# Patient Record
Sex: Female | Born: 1935 | Race: White | Hispanic: No | Smoking: Never smoker
Health system: Southern US, Community
[De-identification: ages and names within clinical notes are randomized; demographics above are authoritative.]

## PROBLEM LIST (undated history)

## (undated) DIAGNOSIS — F32A Depression, unspecified: Secondary | ICD-10-CM

## (undated) DIAGNOSIS — F0390 Unspecified dementia without behavioral disturbance: Secondary | ICD-10-CM

## (undated) DIAGNOSIS — I214 Non-ST elevation (NSTEMI) myocardial infarction: Secondary | ICD-10-CM

## (undated) DIAGNOSIS — I10 Essential (primary) hypertension: Secondary | ICD-10-CM

## (undated) DIAGNOSIS — I82409 Acute embolism and thrombosis of unspecified deep veins of unspecified lower extremity: Secondary | ICD-10-CM

## (undated) DIAGNOSIS — F329 Major depressive disorder, single episode, unspecified: Secondary | ICD-10-CM

## (undated) DIAGNOSIS — T7840XA Allergy, unspecified, initial encounter: Secondary | ICD-10-CM

## (undated) HISTORY — DX: Unspecified dementia, unspecified severity, without behavioral disturbance, psychotic disturbance, mood disturbance, and anxiety: F03.90

## (undated) HISTORY — DX: Depression, unspecified: F32.A

## (undated) HISTORY — DX: Major depressive disorder, single episode, unspecified: F32.9

## (undated) HISTORY — DX: Essential (primary) hypertension: I10

## (undated) HISTORY — DX: Allergy, unspecified, initial encounter: T78.40XA

---
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
## 1966-03-02 HISTORY — PX: ABDOMINAL HYSTERECTOMY: SHX81

## 1981-03-02 HISTORY — PX: SPINE SURGERY: SHX786

## 1990-03-02 HISTORY — PX: EYE SURGERY: SHX253

## 1990-03-02 LAB — HM COLONOSCOPY

## 1993-03-02 HISTORY — PX: BREAST SURGERY: SHX581

## 2005-08-25 ENCOUNTER — Ambulatory Visit: Payer: Self-pay | Admitting: Internal Medicine

## 2005-09-03 ENCOUNTER — Ambulatory Visit: Payer: Self-pay | Admitting: Internal Medicine

## 2005-09-08 ENCOUNTER — Ambulatory Visit: Payer: Self-pay | Admitting: *Deleted

## 2005-09-22 ENCOUNTER — Ambulatory Visit: Payer: Self-pay | Admitting: Internal Medicine

## 2005-11-23 ENCOUNTER — Ambulatory Visit: Payer: Self-pay | Admitting: Family Medicine

## 2006-01-25 ENCOUNTER — Ambulatory Visit: Payer: Self-pay | Admitting: Family Medicine

## 2006-07-05 ENCOUNTER — Telehealth (INDEPENDENT_AMBULATORY_CARE_PROVIDER_SITE_OTHER): Payer: Self-pay | Admitting: *Deleted

## 2006-07-06 ENCOUNTER — Ambulatory Visit: Payer: Self-pay | Admitting: Family Medicine

## 2006-07-06 DIAGNOSIS — I10 Essential (primary) hypertension: Secondary | ICD-10-CM

## 2006-07-06 DIAGNOSIS — G309 Alzheimer's disease, unspecified: Secondary | ICD-10-CM

## 2006-07-06 DIAGNOSIS — F0280 Dementia in other diseases classified elsewhere without behavioral disturbance: Secondary | ICD-10-CM

## 2006-07-07 ENCOUNTER — Telehealth (INDEPENDENT_AMBULATORY_CARE_PROVIDER_SITE_OTHER): Payer: Self-pay | Admitting: *Deleted

## 2006-07-08 ENCOUNTER — Encounter: Payer: Self-pay | Admitting: Family Medicine

## 2006-07-09 ENCOUNTER — Inpatient Hospital Stay (HOSPITAL_COMMUNITY): Admission: AD | Admit: 2006-07-09 | Discharge: 2006-07-13 | Payer: Self-pay | Admitting: Internal Medicine

## 2006-07-09 ENCOUNTER — Ambulatory Visit: Payer: Self-pay | Admitting: Family Medicine

## 2006-07-10 ENCOUNTER — Ambulatory Visit: Payer: Self-pay | Admitting: Internal Medicine

## 2006-07-16 ENCOUNTER — Ambulatory Visit: Payer: Self-pay | Admitting: Family Medicine

## 2006-07-25 ENCOUNTER — Emergency Department: Payer: Self-pay | Admitting: Emergency Medicine

## 2006-07-25 ENCOUNTER — Other Ambulatory Visit: Payer: Self-pay

## 2006-09-28 ENCOUNTER — Telehealth: Payer: Self-pay | Admitting: Family Medicine

## 2006-10-14 ENCOUNTER — Encounter (INDEPENDENT_AMBULATORY_CARE_PROVIDER_SITE_OTHER): Payer: Self-pay | Admitting: *Deleted

## 2006-10-19 ENCOUNTER — Ambulatory Visit: Payer: Self-pay | Admitting: Family Medicine

## 2007-01-21 ENCOUNTER — Encounter (INDEPENDENT_AMBULATORY_CARE_PROVIDER_SITE_OTHER): Payer: Self-pay | Admitting: *Deleted

## 2007-05-01 ENCOUNTER — Emergency Department: Payer: Self-pay | Admitting: Emergency Medicine

## 2007-05-01 ENCOUNTER — Other Ambulatory Visit: Payer: Self-pay

## 2007-05-01 ENCOUNTER — Encounter: Payer: Self-pay | Admitting: Family Medicine

## 2007-05-05 ENCOUNTER — Ambulatory Visit: Payer: Self-pay | Admitting: Family Medicine

## 2007-05-20 ENCOUNTER — Telehealth: Payer: Self-pay | Admitting: Family Medicine

## 2007-06-06 ENCOUNTER — Telehealth: Payer: Self-pay | Admitting: Family Medicine

## 2007-06-08 ENCOUNTER — Ambulatory Visit: Payer: Self-pay | Admitting: Family Medicine

## 2007-06-22 ENCOUNTER — Ambulatory Visit: Payer: Self-pay | Admitting: Family Medicine

## 2007-10-06 ENCOUNTER — Ambulatory Visit: Payer: Self-pay | Admitting: Family Medicine

## 2007-10-17 ENCOUNTER — Ambulatory Visit: Payer: Self-pay | Admitting: Family Medicine

## 2007-10-17 LAB — CONVERTED CEMR LAB
BUN: 17 mg/dL (ref 6–23)
CO2: 30 meq/L (ref 19–32)
Calcium: 9.4 mg/dL (ref 8.4–10.5)
Glucose, Bld: 97 mg/dL (ref 70–99)
Sodium: 141 meq/L (ref 135–145)

## 2007-11-09 ENCOUNTER — Ambulatory Visit: Payer: Self-pay | Admitting: Family Medicine

## 2008-01-24 ENCOUNTER — Telehealth: Payer: Self-pay | Admitting: Family Medicine

## 2008-02-02 ENCOUNTER — Telehealth: Payer: Self-pay | Admitting: Family Medicine

## 2008-02-13 ENCOUNTER — Ambulatory Visit: Payer: Self-pay | Admitting: Family Medicine

## 2008-10-31 ENCOUNTER — Telehealth: Payer: Self-pay | Admitting: Family Medicine

## 2009-05-15 ENCOUNTER — Ambulatory Visit: Payer: Self-pay | Admitting: Family Medicine

## 2009-06-27 ENCOUNTER — Encounter: Payer: Self-pay | Admitting: Family Medicine

## 2009-06-28 ENCOUNTER — Telehealth: Payer: Self-pay | Admitting: Family Medicine

## 2009-08-07 ENCOUNTER — Encounter: Payer: Self-pay | Admitting: Family Medicine

## 2010-02-07 ENCOUNTER — Encounter: Payer: Self-pay | Admitting: Family Medicine

## 2010-02-21 ENCOUNTER — Encounter: Payer: Self-pay | Admitting: Family Medicine

## 2010-02-21 DIAGNOSIS — M1610 Unilateral primary osteoarthritis, unspecified hip: Secondary | ICD-10-CM

## 2010-02-21 DIAGNOSIS — R269 Unspecified abnormalities of gait and mobility: Secondary | ICD-10-CM

## 2010-02-21 DIAGNOSIS — IMO0001 Reserved for inherently not codable concepts without codable children: Secondary | ICD-10-CM

## 2010-02-28 ENCOUNTER — Encounter: Payer: Self-pay | Admitting: Family Medicine

## 2010-04-01 NOTE — Progress Notes (Signed)
Summary: needs FL2 completed  Phone Note Call from Patient Call back at Home Phone [phone]   Caller: Daughter- June Summary of Call: Pt's daughter wants to get pt into assisted living at [facility].  She has dropped off an FL2 form to be completed.  She will pick this up when ready.  Form is on your desk. Initial call taken by: Lowella Petties CMA,  June 28, 2009 10:17 AM

## 2010-04-01 NOTE — Miscellaneous (Signed)
Summary: FL2  FL2   Imported By: Lanelle Bal 08/14/2009 10:25:45  _____________________________________________________________________  External Attachment:    Type:   Image     Comment:   External Document

## 2010-04-01 NOTE — Miscellaneous (Signed)
Summary: FL2  FL2   Imported By: Lanelle Bal 07/19/2009 14:08:04  _____________________________________________________________________  External Attachment:    Type:   Image     Comment:   External Document

## 2010-04-01 NOTE — Assessment & Plan Note (Signed)
Summary: FILL OUT FORM FOR VA/CLE   Vital Signs:  Patient profile:   77 year old female Height:      67 inches Weight:      181.8 pounds Temp:     97.6 degrees F oral Pulse rate:   100 / minute Pulse rhythm:   regular BP sitting:   106 / 60  (right arm) Cuff size:   large  Vitals Entered By: Benny Lennert CMA Duncan Dull) (May 15, 2009 9:35 AM)  History of Present Illness: Chief complaint fill out paper work for [employer]  Daughter in talking to [facility]...she is WW2 widow veteran...can qualify for widows benefits. Daughter is looking into having Ms. Creek look into [employer]...only gets 2 hour a day care wherre she is know. le..somewhat more aware.   Dementia, stable. Not on aricept or namenda..Daighter not interested in [employer].Tanylah Yoho not interested in [employer].  Well controlled depression of medicaiton.   Problems Prior to Update: 1)  Syncope  (ICD-780.2) 2)  Anxiety  (ICD-300.00) 3)  Constipation  (ICD-564.00) 4)  Hypertension  (ICD-401.9) 5)  Depression  (ICD-311) 6)  Dementia  (ICD-294.8) 7)  Cellulitis  (ICD-682.9)  Current Medications (verified): 1)  Walker 2)  Lisinopril-Hydrochlorothiazide 10-12.5 Mg  Tabs (Lisinopril-Hydrochlorothiazide) .Marland Kitchen.. 1 Tab By Mouth Daily  Allergies: 1)  ! Penicillin G Potassium (Penicillin G Potassium)  Past History:  Past medical, surgical, family and social histories (including risk factors) reviewed, and no changes noted (except as noted below).  Past Medical History: Reviewed history from 07/06/2006 and no changes required. Dementia Depression Hypertension  Past Surgical History: Reviewed history from 07/08/2006 and no changes required. 1938 Tonsillectomy 1968 Hysterectomy 1983 Surgery for ruptured disc lumbar 1992 Bilateral cataract removal 1995 Lumpectomy, left breast - benign  Family History: Reviewed history from 07/08/2006 and no changes required. Father:  Died 107 never sick, old  age Mother: Died 26 leukemia Siblings: brother died 20 cancer of stomach, heavy smoker brother died severe rheumatoid arthritis, probs breathing, suicide brother died suicide sister died HTN sister died 66 valve replacement sister died 8, heart trouble sister died (?) CV: (+) HBP:  (+) DM:  (-) Arthritis:  (+) Breast CA:  daughter Colon CA:  (+) brother Depression:  (+) family, older brothers committed suicide  Social History: Reviewed history from 07/08/2006 and no changes required. Marital Status: widowed since October Children:  Occupation: Secretarial work, [employer] Hobbies:  Walking, reading Exercise:  Walking Never Smoked Alcohol use-no Drug use-no  Review of Systems General:  Denies fatigue and fever. CV:  Denies chest pain or discomfort. Resp:  Denies shortness of breath. GI:  Denies abdominal pain. GU:  Denies dysuria.  Physical Exam  General:  elderly female in NAD, smiling, pleasant Mouth:  Oral mucosa and oropharynx without lesions or exudates.  MMM Neck:  no carotid bruit or thyromegaly  Lungs:  Normal respiratory effort, chest expands symmetrically. Lungs are clear to auscultation, no crackles or wheezes. Heart:  Normal rate and regular rhythm. S1 and S2 normal without gallop, murmur, click, rub or other extra sounds. Msk:  using walker, decrease ROM neck, lumbar spine Walks fairly well with walker for stability Pulses:  R and L posterior tibial pulses are full and equal bilaterally  Extremities:  no edema  Neurologic:  No cranial nerve deficits noted. DTRs are symmetrical throughout. Sensory, motor functions appear intact.finger-to-nose normal and abnormal gait.     Impression & Recommendations:  Problem # 1:  DEMENTIA (ICD-294.8)  Stable on no medicaiton. Forms for VA completed.   Problem # 2:  HYPERTENSION (ICD-401.9) Well controlled on current meds.  Her updated medication list for this problem includes:     Lisinopril-hydrochlorothiazide 10-12.5 Mg Tabs (Lisinopril-hydrochlorothiazide) .Marland Kitchen... 1 tab by mouth daily  Problem # 3:  DEPRESSION (ICD-311) Resolved..on no medicaiton.  The following medications were removed from the medication list:    Paroxetine Hcl 20 Mg Tabs (Paroxetine hcl) .Marland Kitchen... 1 tab by mouth daily  Complete Medication List: 1)  Walker  2)  Lisinopril-hydrochlorothiazide 10-12.5 Mg Tabs (Lisinopril-hydrochlorothiazide) .Marland Kitchen.. 1 tab by mouth daily  Current Allergies (reviewed today): ! PENICILLIN G POTASSIUM (PENICILLIN G POTASSIUM)

## 2010-04-03 NOTE — Miscellaneous (Signed)
Summary: PT Orders/[facility]  PT Orders/[facility]   Imported By: Lanelle Bal 03/04/2010 10:59:46  _____________________________________________________________________  External Attachment:    Type:   Image     Comment:   External Document

## 2010-04-03 NOTE — Miscellaneous (Signed)
Summary: PT Eval/[facility]  PT Eval/[facility]   Imported By: Lanelle Bal 03/07/2010 14:10:51  _____________________________________________________________________  External Attachment:    Type:   Image     Comment:   External Document

## 2010-04-03 NOTE — Miscellaneous (Signed)
Summary: [facility] Order[facility]  [facility]   Imported By: Lanelle Bal 02/13/2010 16:02:39  _____________________________________________________________________  External Attachment:    Type:   Image     Comment:   External Document

## 2010-04-29 ENCOUNTER — Ambulatory Visit (INDEPENDENT_AMBULATORY_CARE_PROVIDER_SITE_OTHER): Payer: Medicare Other | Admitting: Family Medicine

## 2010-04-29 ENCOUNTER — Encounter: Payer: Self-pay | Admitting: Family Medicine

## 2010-04-29 DIAGNOSIS — R21 Rash and other nonspecific skin eruption: Secondary | ICD-10-CM | POA: Insufficient documentation

## 2010-04-29 DIAGNOSIS — I10 Essential (primary) hypertension: Secondary | ICD-10-CM

## 2010-04-29 DIAGNOSIS — F068 Other specified mental disorders due to known physiological condition: Secondary | ICD-10-CM

## 2010-05-08 NOTE — Assessment & Plan Note (Signed)
Summary: fill out forms for va for widow benefits /alc   Vital Signs:  Patient profile:   77 year old female Height:      67 inches Weight:      168.75 pounds BMI:     26.53 Temp:     97.7 degrees F oral Pulse rate:   80 / minute Pulse rhythm:   regular BP sitting:   130 / 80  (left arm) Cuff size:   regular  Vitals Entered By: Benny Lennert CMA Duncan Dull) (April 29, 2010 9:25 AM)  History of Present Illness: 77 year old  female with history of dementia, HTN and depression presents to have administrative physical to complete forms for widow benifits. She is WW2  veteran widow...can qualify for widows benefits.   Dementia, stable on no medication per her daughter who is present at todays OV. She did not tolerate aricept and namenda in past.  She is  living in an independant living situation and doing well... at [facility]. She did not qualify for [facility]. Has caregiver hired to help around the house.. comes over three days a week. [place] provides one meal a day. Cannot cook, clean, do finances, cannot set out medicaitons.  She picks at her arms and legs...denies itchy skin. Lotion helps.  HTN, well controlled.. has stopped her medicaiton since weight loss. Has lost 20 lbs ointentionally per daughter. She eats and drinks very well. Minimal walking or exercsie.   Depression, resolved. No behavoiral problems per daughter.  No recent falls.  Walks very slowly.. uses walker at all times, with seat.  Hearing has worsened but does not bother her. She reads pretty much all day.  Problems Prior to Update: 1)  Skin Rash  (ICD-782.1) 2)  Hypertension  (ICD-401.9) 3)  Dementia  (ICD-294.8)  Current Medications (verified): 1)  Walker 2)  Lisinopril-Hydrochlorothiazide 10-12.5 Mg  Tabs (Lisinopril-Hydrochlorothiazide) .Marland Kitchen.. 1 Tab By Mouth Daily  Allergies (verified): 1)  ! Penicillin G Potassium (Penicillin G Potassium)  Past History:  Past medical, surgical, family and  social histories (including risk factors) reviewed, and no changes noted (except as noted below).  Past Medical History: Reviewed history from 07/06/2006 and no changes required. Dementia Depression Hypertension  Past Surgical History: Reviewed history from 07/08/2006 and no changes required. 1938 Tonsillectomy 1968 Hysterectomy 1983 Surgery for ruptured disc lumbar 1992 Bilateral cataract removal 1995 Lumpectomy, left breast - benign  Family History: Reviewed history from 07/08/2006 and no changes required. Father:  Died 11 never sick, old age Mother: Died 89 leukemia Siblings: brother died 35 cancer of stomach, heavy smoker brother died severe rheumatoid arthritis, probs breathing, suicide brother died suicide sister died HTN sister died 29 valve replacement sister died 75, heart trouble sister died (?) CV: (+) HBP:  (+) DM:  (-) Arthritis:  (+) Breast CA:  daughter Colon CA:  (+) brother Depression:  (+) family, older brothers committed suicide  Social History: Reviewed history from 07/08/2006 and no changes required. Marital Status: widowed since October Children:  Occupation: Secretarial work, [employer] Hobbies:  Walking, reading Exercise:  Walking Never Smoked Alcohol use-no Drug use-no  Review of Systems General:  Denies fatigue and fever. CV:  Denies chest pain or discomfort. Resp:  Denies shortness of breath. GI:  Denies abdominal pain. GU:  Denies dysuria.  Physical Exam  General:  overweight female in NAD  Ears:  External ear exam shows no significant lesions or deformities.  Otoscopic examination reveals clear canals, tympanic membranes are intact  bilaterally without bulging, retraction, inflammation or discharge. Hearing is grossly normal bilaterally. Nose:  External nasal examination shows no deformity or inflammation. Nasal mucosa are pink and moist without lesions or exudates. Mouth:  Oral mucosa and oropharynx without lesions or  exudates.  Teeth in good repair. Neck:  no carotid bruit or thyromegaly no cervical or supraclavicular lymphadenopathy  Lungs:  Normal respiratory effort, chest expands symmetrically. Lungs are clear to auscultation, no crackles or wheezes. Heart:  Normal rate and regular rhythm. S1 and S2 normal without gallop, murmur, click, rub or other extra sounds. Abdomen:  Bowel sounds positive,abdomen soft and non-tender without masses, organomegaly or hernias noted. Pulses:  R and L posterior tibial pulses are full and equal bilaterally  Extremities:  no edema Neurologic:  alert & oriented X3.  22/30 MMSE Skin:  dry skin and areas of erythematous nodules, scarring Psych:  good eye contact, not anxious appearing, and not depressed appearing.     Impression & Recommendations:  Problem # 1:  DEMENTIA (ICD-294.8) She has stable moderate dementia... she is unable to perform activities of daily living such as bathing, cooking, cleaning, finances, medicaitions due to her dementia.  She cannot drive.   MMSE today showed 22/30. She needs daily assistance for caregivers.  Problem # 2:  HYPERTENSION (ICD-401.9) Well controlled since weight loss... no wstable off medicaiton. Encouraged exercise as tolerated, weight loss, healthy eating habits.  The following medications were removed from the medication list:    Lisinopril-hydrochlorothiazide 10-12.5 Mg Tabs (Lisinopril-hydrochlorothiazide) .Marland Kitchen... 1 tab by mouth daily  Problem # 3:  SKIN RASH (ICD-782.1)  Due to dry skoin and constant picking at skin... uncontrollable per pt and daughter. Will treat with Sarna lotion and topical steorid.   Her updated medication list for this problem includes:    Triamcinolone Acetonide 0.5 % Crea (Triamcinolone acetonide) .Marland Kitchen... Aaa two times a day  Complete Medication List: 1)  Walker  2)  Triamcinolone Acetonide 0.5 % Crea (Triamcinolone acetonide) .... Aaa two times a day  Patient Instructions: 1)  Schedule medicare  wellness in 6 months. 2)  Fasting labs prior BMET, cbc Dx 401.1 3)  Apply SARNA lotion to skin for itching. 4)  May use steroid cream on irritated areas two times a day x 2 weeks. ... [facility] to [facility] [address].  5)    Prescriptions: TRIAMCINOLONE ACETONIDE 0.5 % CREA (TRIAMCINOLONE ACETONIDE) AAA two times a day  #30 gm x 0   Entered and Authorized by:   Kerby Nora MD   Signed by:   Kerby Nora MD on 04/29/2010   Method used:   [facility] to        [facility] [phone]* (retail)       [address] [place], [place]  [address]       Ph: [phone] or [phone]       Fax: [phone]   RxID:   [id number]    Orders Added: 1)  Est. Patient Level IV [81829]    Current Allergies (reviewed today): ! PENICILLIN G POTASSIUM (PENICILLIN G POTASSIUM)

## 2010-05-13 NOTE — Letter (Signed)
Summary: MMSE Form  MMSE Form   Imported By: Lanelle Bal 05/06/2010 08:36:32  _____________________________________________________________________  External Attachment:    Type:   Image     Comment:   External Document

## 2010-06-30 ENCOUNTER — Telehealth: Payer: Self-pay | Admitting: Internal Medicine

## 2010-07-15 NOTE — H&P (Signed)
[facility] ADMISSION   NAME:Suzanne Mcmillan, Suzanne Mcmillan                          MRN:          [id number]  DATE:07/09/2006                            DOB:          1916-02-26    CHIEF COMPLAINT:  Cellulitis.   HISTORY OF PRESENT ILLNESS:  Ms. Suzanne Mcmillan is an 77 year old female with a  history of dementia, depression and hypertension who presented to clinic  today for followup on a rash. She initially noted an erythematous rash  on her left forearm after she scratched her arm on a screen door  approximately 1 month ago. The area stayed looking okay but did not heal  and then appeared to have spreading erythema around the site. She and  her daughter applied antibiotic ointment and kept the area covered but  the rash has gradually spread along her left forearm. She was seen at an  office visit on June 06, 2006 and was placed on Bactrim for possible  cellulitis. Bactrim was chosen because of allergy to PENICILLIN and  because she lives in a senior center and is at risk for MRSA. After  taking one dose of the Bactrim, she began having facial swelling and  itching. Bactrim was then stopped and she was changed over to  doxicycline on Jul 07, 2006. She has now had 3 total days of oral  antibiotics but the rash continues to spread up her arm. The rash is  nontender but is warm to the touch. She does not feel ill otherwise and  denies fever, chills, nausea, vomiting,  and diarrhea.   PAST MEDICAL HISTORY:  1. Dementia.  2. Depression.  3. Hypertension.   PAST SURGICAL HISTORY:  1. Tonsillectomy.  2. Hysterectomy.  3. Back surgery.  4. Bilateral cataract removal.  5. Lumpectomy for left benign breast tumor.   FAMILY HISTORY:  Father died at age 62, never sick and passed away of  old age. Mother died at age 90 with leukemia. She has a brother who died  at age 99 with stomach cancer as well as a brother who had severe  rheumatoid  arthritis and another brother who died of suicide. She had  several other sisters who had various high blood pressure, heart trouble  and valve replacement.   REVIEW OF SYSTEMS:  See HPI but otherwise intermittent dizziness, no  confusion or mental status changes.   PHYSICAL EXAMINATION:  GENERAL:  Well-developed, well-nourished in no  acute distress. Alert, appropriate and cooperative throughout exam.  HEENT:  Head normocephalic, atraumatic without obvious abnormalities.  Eyes no corneal or conjunctival inflammation. Extraocular muscles  intact. PERRLA. Vision grossly normal. Ears, no external ear lesions.  TMs clear. Hearing grossly normal bilaterally. External nasal exam  without deformity or inflammation. Pink, moist nasal mucosa. Mouth,  dentures, moist mucous membranes.  NECK:  No deformities, masses, tenderness, thyromegaly or cervical  lymphadenopathy.  LUNGS:  Clear to auscultation bilaterally, no wheezes, rales or rhonchi.  HEART:  Regular rate and rhythm, no murmurs, rubs or gallops. Normal  PMI, 2+ peripheral pulses.  ABDOMEN:  Bowel sounds positive. Abdomen soft, nontender without masses,  organomegaly or hernias noted.  NEUROLOGIC:  Cranial nerves II-XII grossly intact. Station and gait are  within normal limits. Plantar reflexes normal bilaterally. Sensory,  motor and coordinated functions intact.  SKIN:  Erythematous rash on left forearm with beefy red, dry central  plaque around 12 cm x 10 cm with surrounding lighter erythema  circumferentially around arm and up to mid upper arm. Of note on  previous exam 2 days earlier, there was no rash above her antecubital  fossa.   ASSESSMENT/PLAN:  1. Cellulitis, worsening:  Will admit her to hospitalization for IV      antibiotics given her lack of response to oral antibiotics and her      age. I expect a short admission. I will begin IV Vancomycin given      the concern for possible MRSA. I will check a CBC with this,  BMET      and blood culture. She will be made [facility] but will have diet as      tolerated.  2. Hypertension, poor control:  She previously had the diagnosis of      high blood pressure but was taken off medication by her previous      doctor. Her blood pressure has been elevated at the last few visits      so therefore on admission we will begin her on hydrochlorothiazide      25 mg daily. We will follow blood pressure in the hospital.  3. Dementia, mild: She stopped her Aricept because there was no      improvement. This is currently stable.  4. Depression, improved:  She stopped her Paxil because she felt      better.     Kerby Nora, MD     AB/MedQ  DD: 07/09/2006  DT: 07/09/2006  Job #: [id number]

## 2010-07-15 NOTE — Discharge Summary (Signed)
Suzanne Mcmillan, Suzanne Mcmillan NO.:  [id number]   MEDICAL RECORD NO.:  [id number]          PATIENT TYPE:  INP   LOCATION:  5743                         FACILITY:  [facility]   PHYSICIAN:  Gordy Savers, MDDATE OF BIRTH:  Aug 01, 1915   DATE OF ADMISSION:  07/09/2006  DATE OF DISCHARGE:  07/13/2006                               DISCHARGE SUMMARY   FINAL DIAGNOSIS:  Cellulitis left lower arm.   ADDITIONAL DIAGNOSES:  1. Hypertension.  2. Dementia.   DISCHARGE MEDICATIONS:  1. Omnicef 300 mg b.i.d.  2. Aricept 10 mg daily.  3. Lotensin 10 mg daily.  4. Hydrochlorothiazide 25 mg daily.   HISTORY OF PRESENT ILLNESS:  The patient is an 77 year old white female  who is followed by Dr. __________ at [facility] at [facility].  She  presented with a rash involving her left forearm area that began after  trauma approximately one month prior.  This was initially self treated  with topical antibiotic ointment and on Jul 06, 2006 was placed on  Bactrim for cellulitis.  The patient had a prior history of penicillin  allergy.  The patient had allergic reaction to the Bactrim and  doxycycline was substituted on Jul 07, 2006.  In spite of oral antibiotic  therapy, the rash continued to intensify and spread.  She was  subsequently admitted for parenteral antibiotic therapy.   LABORATORY DATA AND HOSPITAL COURSE:  The patient was admitted to the  hospital and placed on IV vancomycin which she tolerated well.  The  dermatitis improved steadily and at the time of discharge had much less  erythema, swelling and basically had some postinflammatory erythema and  flaking.  She remained afebrile during the entire hospital stay.  Laboratory studies included CBC that revealed a normal white count of  5.9.  Chemistries were unremarkable.  Due to high blood pressure,  benazepril 10 mg daily was added to her regimen.  Her mild dementia and  depression remained stable.   DISPOSITION:  The patient  will be discharged today with close outpatient  follow-up.  She will be discharged on five additional days of Omnicef  300 mg b.i.d.  She will return to her primary care [facility] Touch within the  next few days.   CONDITION ON DISCHARGE:  Improved.      Gordy Savers, MD  Electronically Signed     PFK/MEDQ  D:  07/13/2006  T:  07/13/2006  Job:  [id number]

## 2010-11-18 ENCOUNTER — Telehealth: Payer: Self-pay | Admitting: *Deleted

## 2010-11-18 NOTE — Telephone Encounter (Signed)
Heather.. Will you please call daughter...any med changes from last OV? Also please ask self care questions listed in second half of FL2... Ie. Find out now how much assistance she needs with eating, clothing, bathing etc.

## 2010-11-18 NOTE — Telephone Encounter (Signed)
Spoke with daughter patient doesn't take any medication at all. Patient also requires help with clothes and bathing and she eats on her on. Patient can not hear at all. She has memory loss one day she is fine and then the next she cant remember anything. Patient is also incontinent. Patient will be going to the assisted living. Patient is being evicted from the independent living she is in because she walked down 3 flights of stairs and she needed help back to her room.

## 2010-11-18 NOTE — Telephone Encounter (Signed)
Pt is having to move from the facility where she is living because they can no longer care for her with her degree of memory loss.  Daughter is trying to get her into another facility and asks if you will fill out an FL2 form for her.  Advised her that you would, she wants to place the patient in a memory care facility.  She will drop off the FL2.

## 2010-11-20 DIAGNOSIS — Z0279 Encounter for issue of other medical certificate: Secondary | ICD-10-CM

## 2010-11-20 NOTE — Telephone Encounter (Signed)
Patients daughter advised and will pick up

## 2010-11-20 NOTE — Telephone Encounter (Signed)
Daughter brought in [facility] form, this is on your desk.

## 2010-11-20 NOTE — Telephone Encounter (Signed)
Completed in outbox.

## 2010-11-26 ENCOUNTER — Telehealth: Payer: Self-pay | Admitting: Family Medicine

## 2010-11-26 NOTE — Telephone Encounter (Signed)
Spoke with Liborio Nixon and she advised another FL2 was faxed and she needs it faxed back as tomorrow when dr. Ermalene Searing in office  [phone]

## 2010-11-26 NOTE — Telephone Encounter (Signed)
Liborio Nixon called and said that the patient is moving to [facility] today and that the [facility] has been sent for documentation and they are calling to inquire about form.  Call back at [phone]

## 2010-12-04 DIAGNOSIS — G309 Alzheimer's disease, unspecified: Secondary | ICD-10-CM

## 2010-12-04 DIAGNOSIS — R32 Unspecified urinary incontinence: Secondary | ICD-10-CM

## 2010-12-04 DIAGNOSIS — I10 Essential (primary) hypertension: Secondary | ICD-10-CM

## 2010-12-04 DIAGNOSIS — F0280 Dementia in other diseases classified elsewhere without behavioral disturbance: Secondary | ICD-10-CM

## 2010-12-05 ENCOUNTER — Telehealth: Payer: Self-pay | Admitting: *Deleted

## 2010-12-05 NOTE — Telephone Encounter (Signed)
2.5 percent okay

## 2010-12-05 NOTE — Telephone Encounter (Signed)
Script for hydrocortisone 2% cream was sent in yesterday.  They have 1% and 2.5%, but not 2.  Ok to change to another strength?

## 2010-12-05 NOTE — Telephone Encounter (Signed)
Pharmacy advised  

## 2010-12-12 ENCOUNTER — Encounter: Payer: Self-pay | Admitting: Family Medicine

## 2010-12-12 ENCOUNTER — Telehealth: Payer: Self-pay | Admitting: *Deleted

## 2010-12-12 NOTE — Telephone Encounter (Signed)
Needs to be seen before med restarted.. Due for AMW with fasting labs prior

## 2010-12-12 NOTE — Telephone Encounter (Signed)
Daughter states that her mom is not sleeping well and she is asking for a Rx refill on Paxil.  Looks like this medication hasn't been refilled since 2011.  Please advise.

## 2010-12-12 NOTE — Telephone Encounter (Signed)
Patient has appt.on monday

## 2010-12-15 ENCOUNTER — Encounter: Payer: Self-pay | Admitting: Family Medicine

## 2010-12-15 ENCOUNTER — Ambulatory Visit (INDEPENDENT_AMBULATORY_CARE_PROVIDER_SITE_OTHER): Payer: Medicare Other | Admitting: Family Medicine

## 2010-12-15 DIAGNOSIS — G4700 Insomnia, unspecified: Secondary | ICD-10-CM | POA: Insufficient documentation

## 2010-12-15 DIAGNOSIS — R21 Rash and other nonspecific skin eruption: Secondary | ICD-10-CM

## 2010-12-15 DIAGNOSIS — I10 Essential (primary) hypertension: Secondary | ICD-10-CM

## 2010-12-15 DIAGNOSIS — Z23 Encounter for immunization: Secondary | ICD-10-CM

## 2010-12-15 NOTE — Assessment & Plan Note (Signed)
Well controlled on no medication. Due for lab check.

## 2010-12-15 NOTE — Patient Instructions (Signed)
See scanned in detail of instructions on [facility] sheet.

## 2010-12-15 NOTE — Progress Notes (Signed)
  Subjective:    Patient ID: Suzanne Mcmillan, female    DOB: [birth year], 77 y.o.   MRN: [id number]  HPI  77 year old lady with likely alzheimer's dementia presents with RN from [facility].  In last few months, she has entered a  Assisted living facility for dementia patients.  Using walker. Wight stable  Daughter is not present today.  At night she is up and down at night.Marland Kitchen occ in other pt rooms. More confused at night.. In a new place etc. Not napping during the day. No tearfulness, no emotional changes noted per staff. In past, she has been paxil.    She has had rash in last several month... Started on arms and now has moved to her legs. No change with hydrocotisone cream 2.5 % in last week. She does scratching rash some..but pt say she is not itchy.      Review of Systems  Constitutional: Negative for fever and fatigue.  HENT: Negative for ear pain.   Eyes: Negative for pain.  Respiratory: Negative for chest tightness and shortness of breath.   Cardiovascular: Negative for chest pain, palpitations and leg swelling.  Gastrointestinal: Negative for abdominal pain.  Genitourinary: Negative for dysuria.       Objective:   Physical Exam  Constitutional: Vital signs are normal. She appears well-developed and well-nourished. She is cooperative.  Non-toxic appearance. She does not appear ill. No distress.       Very pleasant elderly lady wth wlaker and slowed gait  HENT:  Head: Normocephalic.  Right Ear: Hearing, tympanic membrane, external ear and ear canal normal. Tympanic membrane is not erythematous, not retracted and not bulging.  Left Ear: Hearing, tympanic membrane, external ear and ear canal normal. Tympanic membrane is not erythematous, not retracted and not bulging.  Nose: No mucosal edema or rhinorrhea. Right sinus exhibits no maxillary sinus tenderness and no frontal sinus tenderness. Left sinus exhibits no maxillary sinus tenderness and no frontal sinus tenderness.    Mouth/Throat: Uvula is midline, oropharynx is clear and moist and mucous membranes are normal.  Eyes: Conjunctivae, EOM and lids are normal. Pupils are equal, round, and reactive to light. No foreign bodies found.  Neck: Trachea normal and normal range of motion. Neck supple. Carotid bruit is not present. No mass and no thyromegaly present.  Cardiovascular: Normal rate, regular rhythm, S1 normal, S2 normal, normal heart sounds, intact distal pulses and normal pulses.  Exam reveals no gallop and no friction rub.   No murmur heard. Pulmonary/Chest: Effort normal and breath sounds normal. Not tachypneic. No respiratory distress. She has no decreased breath sounds. She has no wheezes. She has no rhonchi. She has no rales.  Abdominal: Soft. Normal appearance and bowel sounds are normal. There is no tenderness.  Neurological: She is alert.       Only oriented to self, no place or time.   Skin: Skin is warm, dry and intact. Rash noted. Rash is nodular.       Areas of excoriationa nd nodules that appear scabbed where she has picked, no blisteres, pustules or erythema. He skin is very dry.  Psychiatric: Her speech is normal and behavior is normal. Judgment and thought content normal. Her mood appears not anxious. Cognition and memory are normal. She does not exhibit a depressed mood.          Assessment & Plan:

## 2010-12-15 NOTE — Assessment & Plan Note (Signed)
Along with puritits. Appears rash may be from picking at skin. Will eval with labs. Treat with topical steroid cream and moisturizing cream.

## 2010-12-15 NOTE — Assessment & Plan Note (Signed)
Start trazodone at bedtime for sleep. No clear need for paxil at this time.

## 2010-12-22 ENCOUNTER — Other Ambulatory Visit (INDEPENDENT_AMBULATORY_CARE_PROVIDER_SITE_OTHER): Payer: Medicare Other

## 2010-12-22 DIAGNOSIS — I10 Essential (primary) hypertension: Secondary | ICD-10-CM

## 2010-12-22 DIAGNOSIS — R21 Rash and other nonspecific skin eruption: Secondary | ICD-10-CM

## 2010-12-22 LAB — CBC WITH DIFFERENTIAL/PLATELET
Basophils Relative: 0.6 % (ref 0.0–3.0)
Eosinophils Absolute: 0.3 10*3/uL (ref 0.0–0.7)
Hemoglobin: 14.2 g/dL (ref 12.0–15.0)
Lymphocytes Relative: 18.7 % (ref 12.0–46.0)
MCHC: 33.6 g/dL (ref 30.0–36.0)
Neutro Abs: 4.9 10*3/uL (ref 1.4–7.7)
RBC: 4.55 Mil/uL (ref 3.87–5.11)

## 2010-12-22 LAB — COMPREHENSIVE METABOLIC PANEL
AST: 27 U/L (ref 0–37)
Alkaline Phosphatase: 82 U/L (ref 39–117)
BUN: 12 mg/dL (ref 6–23)
Calcium: 9.1 mg/dL (ref 8.4–10.5)
Chloride: 106 mEq/L (ref 96–112)
Creatinine, Ser: 0.9 mg/dL (ref 0.4–1.2)

## 2010-12-25 ENCOUNTER — Other Ambulatory Visit: Payer: Self-pay | Admitting: *Deleted

## 2010-12-25 MED ORDER — TRIAMCINOLONE ACETONIDE 0.5 % EX CREA: TOPICAL_CREAM | Freq: Two times a day (BID) | CUTANEOUS | Status: DC

## 2010-12-25 NOTE — Telephone Encounter (Signed)
Faxed refill request from [facility], last filled 12/15/10.

## 2011-01-30 DIAGNOSIS — R262 Difficulty in walking, not elsewhere classified: Secondary | ICD-10-CM

## 2011-01-30 DIAGNOSIS — M6281 Muscle weakness (generalized): Secondary | ICD-10-CM

## 2011-01-30 DIAGNOSIS — I10 Essential (primary) hypertension: Secondary | ICD-10-CM

## 2011-01-30 DIAGNOSIS — IMO0001 Reserved for inherently not codable concepts without codable children: Secondary | ICD-10-CM

## 2011-02-12 ENCOUNTER — Encounter: Payer: Self-pay | Admitting: Family Medicine

## 2011-02-12 ENCOUNTER — Ambulatory Visit (INDEPENDENT_AMBULATORY_CARE_PROVIDER_SITE_OTHER): Payer: Medicare Other | Admitting: Family Medicine

## 2011-02-12 VITALS — BP 120/78 | HR 53 | Temp 97.8°F | Wt 153.8 lb

## 2011-02-12 DIAGNOSIS — Z23 Encounter for immunization: Secondary | ICD-10-CM

## 2011-02-12 DIAGNOSIS — I10 Essential (primary) hypertension: Secondary | ICD-10-CM

## 2011-02-12 DIAGNOSIS — G4700 Insomnia, unspecified: Secondary | ICD-10-CM

## 2011-02-12 DIAGNOSIS — F329 Major depressive disorder, single episode, unspecified: Secondary | ICD-10-CM

## 2011-02-12 DIAGNOSIS — F0280 Dementia in other diseases classified elsewhere without behavioral disturbance: Secondary | ICD-10-CM

## 2011-02-12 DIAGNOSIS — G309 Alzheimer's disease, unspecified: Secondary | ICD-10-CM

## 2011-02-12 NOTE — Assessment & Plan Note (Signed)
Noted on questionarrie completed by daughter... Not currently interested in treatment, due to pt resistant about taking meds. Will follow.

## 2011-02-12 NOTE — Assessment & Plan Note (Signed)
Stable on trazodone 

## 2011-02-12 NOTE — Patient Instructions (Addendum)
Follow up appt in 3 months. Consider shingles vaccine.. Let us know if interested.

## 2011-02-12 NOTE — Assessment & Plan Note (Signed)
Well controlled on no medication  

## 2011-02-12 NOTE — Assessment & Plan Note (Addendum)
Stable.. Did not tolerate meds in past. Extensive forms for widow's benefits from [place] completed.

## 2011-02-12 NOTE — Progress Notes (Signed)
  Subjective:    Patient ID: Suzanne Mcmillan, female    DOB: [birth year], 77 y.o.   MRN: [id number]  HPI  77 year old female with  Alzheimer's dementia presents with daughter to discuss getting qualified for widow's benefits for her. She is now in assisted living facility.  Doing well overall. Needs full assistance except feeding .Marland Kitchen Bathing, dressing, meal prep, ambulatory assistance.  No recent falls. Stays seated majority of day, uses walker.   Has been noting some bleeding gums. No mouth pain per pt.  Hypertension:  Well controlled  Using medication without problems or lightheadedness:  None Chest pain with exertion:None Edema:None Short of breath:None Average home BJY:NWGN controlled. Other issues:  Diarrhea stable.. Using immodium prn. Dermatitis, itching improved with topical steroid cream.  Stable on trazodone nightly for insomnia. Keeps her settled at night.. Previously wandering a night into other pt rooms.    Review of Systems  Constitutional: Negative for fever and fatigue.  HENT: Negative for ear pain.   Eyes: Negative for pain.  Respiratory: Negative for chest tightness and shortness of breath.   Cardiovascular: Negative for chest pain, palpitations and leg swelling.  Gastrointestinal: Negative for abdominal pain.  Genitourinary: Negative for dysuria.       Objective:   Physical Exam  Constitutional:       Elderly female in NAD,  A an oriented x 1   HENT:  Head: Normocephalic.  Right Ear: External ear normal.  Left Ear: External ear normal.  Nose: Nose normal.  Mouth/Throat: Oropharynx is clear and moist. No oropharyngeal exudate.       Poor dentition.. Recommended return to dentist  Eyes: Conjunctivae and EOM are normal. Pupils are equal, round, and reactive to light.  Neck: Normal range of motion. Neck supple. No thyromegaly present.  Cardiovascular: Normal rate, regular rhythm, normal heart sounds and intact distal pulses.  Exam reveals no gallop and no  friction rub.   No murmur heard. Pulmonary/Chest: Effort normal and breath sounds normal. No respiratory distress. She has no wheezes. She has no rales. She exhibits no tenderness.  Abdominal: Soft. Bowel sounds are normal. There is no tenderness.  Musculoskeletal:       5/5 strength, mild atrophy in  Legs Slowed gait, unsteady, using walker  Skin: Skin is warm and dry. No rash noted.  Psychiatric: She has a normal mood and affect. Her speech is normal and behavior is normal. Thought content normal. Cognition and memory are impaired. She exhibits abnormal recent memory and abnormal remote memory.       Depression screen 16... Suggests depression.          Assessment & Plan:

## 2011-02-20 ENCOUNTER — Telehealth: Payer: Self-pay | Admitting: *Deleted

## 2011-02-20 MED ORDER — TRAZODONE HCL 50 MG PO TABS: 75.0000 mg | ORAL_TABLET | Freq: Every day | ORAL | Status: DC

## 2011-02-20 NOTE — Telephone Encounter (Signed)
I would try 1.5 tabs of the trazodone and see if that helps.  If not, then notify PMD.  If any other sx suggestive of concurrent illness- dysuria, cough, fever, then she needs eval by MD.  Thanks.

## 2011-02-20 NOTE — Telephone Encounter (Signed)
Spoke with [employer] and advised results.

## 2011-02-20 NOTE — Telephone Encounter (Signed)
Assisted living calling back saying that patient has not slept since Wednesday. Can anything else be called in.

## 2011-02-20 NOTE — Telephone Encounter (Signed)
Patient not sleeping at all on trazadone and the [facility] assisted [facility] requested something else be called in.

## 2011-03-09 ENCOUNTER — Other Ambulatory Visit: Payer: Medicare Other

## 2011-03-09 ENCOUNTER — Telehealth: Payer: Self-pay | Admitting: *Deleted

## 2011-03-09 NOTE — Telephone Encounter (Signed)
Okay to increase trazodone to 75 mg at noight prn insomnia. If not helping we will need to try different med to treat.

## 2011-03-09 NOTE — Telephone Encounter (Signed)
rx to them

## 2011-03-09 NOTE — Telephone Encounter (Signed)
In outbox

## 2011-03-09 NOTE — Telephone Encounter (Signed)
Pt is not sleeping, nurse request to increase Trazodone by 25 mg (or 1/2 tab) she is currently taking 1 tab qhs and also 25 mg of otc Unisom. Meds aren't helping pt is up most of the night and she gets more anxious as the night goes on. Fequest was faxed for this but has not been returned yet.

## 2011-03-09 NOTE — Telephone Encounter (Signed)
Assisted living needs writen order faxed to them   [phone]

## 2011-03-16 ENCOUNTER — Telehealth: Payer: Self-pay | Admitting: Internal Medicine

## 2011-03-16 NOTE — Telephone Encounter (Signed)
Suzanne Mcmillan from [facility] Increased her Trazadone to 75mg  and she is still not sleeping.  She is obsessing on spots on floor and starts scratching herself and upsets her to the point she cries.  She wanted to know if you could also give her something for anxiety.    [phone]

## 2011-03-17 NOTE — Telephone Encounter (Signed)
Stop unisom, stop trazodone. Change to risperidone 1 mg at bedtime.

## 2011-03-17 NOTE — Telephone Encounter (Signed)
Nurse at [facility] advised of changes

## 2011-03-19 ENCOUNTER — Ambulatory Visit: Payer: Medicare Other | Admitting: Family Medicine

## 2011-03-20 ENCOUNTER — Ambulatory Visit: Payer: Medicare Other | Admitting: Family Medicine

## 2011-03-27 ENCOUNTER — Telehealth: Payer: Self-pay | Admitting: Family Medicine

## 2011-03-27 ENCOUNTER — Ambulatory Visit: Payer: Medicare Other | Admitting: Family Medicine

## 2011-03-27 NOTE — Telephone Encounter (Signed)
Shirlee Limerick requesting discontinue Trazadone on [facility] pharmacy orders.

## 2011-03-27 NOTE — Telephone Encounter (Signed)
Triage Record Num: [id number] Operator: Di Kindle Patient Name: [facility] Call Date & Time: 2/95/6213 10:18:53AM Patient Phone: [phone] PCP: Patient Gender: Female PCP Fax : Patient DOB: October 18, 1915 Practice Name: [facility] Reason for Call: Caller: Liborio Nixon [facility]; PCP: Kerby Nora E.; CB#: [phone]; ; ; Call regarding Requesting a call back. Liborio Nixon states this pt needs an appt asap. She keeps getting appt s changed and messages going to the pt s daughter instead of the assisted living home. Please return her call.; Caller to office for concerns and needs. Protocol(s) Used: PCP Calls, No Triage (Adult) Recommended Outcome per Protocol: Call Provider within 24 Hours Reason for Outcome: [1] Caller requests to speak ONLY to PCP AND [2] nonurgent question Care Advice: ~ 03/27/2011 10:22:30AM Page 1 of 1 [email or web address]

## 2011-03-30 NOTE — Telephone Encounter (Signed)
Order faxed to [facility]

## 2011-03-30 NOTE — Telephone Encounter (Signed)
Can we give verbal.. If so okay to discontinue as already ordered previously.

## 2011-04-29 ENCOUNTER — Other Ambulatory Visit: Payer: Self-pay | Admitting: *Deleted

## 2011-04-29 NOTE — Telephone Encounter (Signed)
Faxed request from [facility], last filled 03/27/11.

## 2011-04-30 MED ORDER — RISPERIDONE 1 MG PO TABS: ORAL_TABLET | ORAL | Status: DC

## 2011-05-19 ENCOUNTER — Telehealth: Payer: Self-pay | Admitting: Family Medicine

## 2011-05-19 ENCOUNTER — Ambulatory Visit: Payer: Medicare Other | Admitting: Family Medicine

## 2011-05-19 NOTE — Telephone Encounter (Signed)
Triage Record Num: [id number] Operator: Craig Guess Patient Name: Suzanne Mcmillan [facility] Call Date & Time: 10/29/5619 4:48:41PM Patient Phone: [phone] PCP: Patient Gender: Female PCP Fax : Patient DOB: Oct 01, 1915 Practice Name: [facility] Reason for Call: Caller: Emmie Niemann Coordinator, PCP: Kerby Nora E.; CB#: [phone]. Call regarding Patient is scratching at her scalp constantly. Annice Pih says it seems to be her nerves. There are no flakes or dandruff. Annice Pih is wondering if there if she can be given something for sxs. Per Skin Lesions Protocol, caller advised Mrs Makar should be seen in the office for eval of sxs. Appt scheduled for Tues 3/19 at 8am with Dr Milinda Antis. Caller is agreeable. Protocol(s) Used: Skin Lesions Recommended Outcome per Protocol: See Provider within 24 hours Reason for Outcome: Skin changes (redness, itching, dryness, peeling, blistering, or swelling) in treatment area following radiation therapy Care Advice: ~ 05/18/2011 5:06:30PM Page 1 of 1 [email or web address]

## 2011-05-19 NOTE — Telephone Encounter (Signed)
To: [facility] (Daytime Triage) Fax: [phone] From: [facility] Date/ Time: 05/19/2011 9:47 AM Taken By: Tomasita Crumble, RN Caller: Liborio Nixon Facility: Not Collected Patient: Triggs, Pennie Rushing DOB: [birth year] Phone: [phone] Reason for Call: Caller was unable to be reached on callback - Left Message Regarding Appointment: No Appt Date: Appt Time: Unknown Provider: Reason: Details: Outcome:

## 2011-05-20 ENCOUNTER — Ambulatory Visit: Payer: Medicare Other | Admitting: Family Medicine

## 2011-06-18 ENCOUNTER — Encounter: Payer: Self-pay | Admitting: Internal Medicine

## 2011-06-18 ENCOUNTER — Ambulatory Visit (INDEPENDENT_AMBULATORY_CARE_PROVIDER_SITE_OTHER): Payer: Medicare Other | Admitting: Internal Medicine

## 2011-06-18 VITALS — BP 150/70 | HR 64 | Temp 97.5°F | Ht 65.75 in | Wt 157.0 lb

## 2011-06-18 DIAGNOSIS — I10 Essential (primary) hypertension: Secondary | ICD-10-CM

## 2011-06-18 DIAGNOSIS — F419 Anxiety disorder, unspecified: Secondary | ICD-10-CM

## 2011-06-18 DIAGNOSIS — F411 Generalized anxiety disorder: Secondary | ICD-10-CM

## 2011-06-18 MED ORDER — SERTRALINE HCL 25 MG PO TABS: 25.0000 mg | ORAL_TABLET | Freq: Every day | ORAL | Status: DC

## 2011-06-19 ENCOUNTER — Encounter: Payer: Self-pay | Admitting: Internal Medicine

## 2011-06-19 DIAGNOSIS — F419 Anxiety disorder, unspecified: Secondary | ICD-10-CM | POA: Insufficient documentation

## 2011-06-19 NOTE — Assessment & Plan Note (Signed)
Blood pressure slightly elevated today. Will recheck in one month.

## 2011-06-19 NOTE — Assessment & Plan Note (Signed)
Will start Zoloft. Patient's daughter will e-mail updates. Patient will return to clinic in one month for reassessment.

## 2011-06-19 NOTE — Progress Notes (Signed)
Subjective:    Patient ID: Suzanne Mcmillan, female    DOB: [birth year], 78 y.o.   MRN: [id number]  HPI 78 year old female with history of dementia presents to establish care. She is changing physicians in our practice because of closer proximity to her home. She denies any concerns today. She reports normal energy level and appetite. She has not had any recent falls. She is happy with her current living situation at assisted care. Her daughter is concerned today about some recent increase in anxiety and occasional agitation. She has been working with her mothers care nurse and would like to consider starting medication to help with anxiety.  Outpatient Encounter Prescriptions as of 06/18/2011  Medication Sig Dispense Refill  . Emollient (CETAPHIL) cream Apply 1 application topically 2 (two) times daily as needed.      . loperamide (IMODIUM) 2 MG capsule Take 2 mg by mouth 4 (four) times daily as needed.        . risperiDONE (RISPERDAL) 1 MG tablet Take 1 tablet by mouth at bedtime as needed for insomnia and agitation.  30 tablet  0  . triamcinolone (KENALOG) 0.5 % cream Apply topically 2 (two) times daily.  60 g  0  . sertraline (ZOLOFT) 25 MG tablet Take 1 tablet (25 mg total) by mouth daily.  30 tablet  1   BP 150/70  Pulse 64  Temp(Src) 97.5 F (36.4 C) (Oral)  Ht 5' 5.75" (1.67 m)  Wt 157 lb (71.215 kg)  BMI 25.53 kg/m2  SpO2 98%  Review of Systems  Constitutional: Negative for fever, chills, appetite change, fatigue and unexpected weight change.  HENT: Negative for ear pain, congestion, sore throat, trouble swallowing, neck pain, voice change and sinus pressure.   Eyes: Negative for visual disturbance.  Respiratory: Negative for cough, shortness of breath, wheezing and stridor.   Cardiovascular: Negative for chest pain, palpitations and leg swelling.  Gastrointestinal: Negative for nausea, vomiting, abdominal pain, diarrhea, constipation, blood in stool, abdominal distention and anal  bleeding.  Genitourinary: Negative for dysuria and flank pain.  Musculoskeletal: Negative for myalgias, arthralgias and gait problem.  Skin: Negative for color change and rash.  Neurological: Negative for dizziness and headaches.  Hematological: Negative for adenopathy. Does not bruise/bleed easily.  Psychiatric/Behavioral: Positive for confusion, decreased concentration and agitation. Negative for suicidal ideas, sleep disturbance and dysphoric mood. The patient is nervous/anxious.    BP 150/70  Pulse 64  Temp(Src) 97.5 F (36.4 C) (Oral)  Ht 5' 5.75" (1.67 m)  Wt 157 lb (71.215 kg)  BMI 25.53 kg/m2  SpO2 98%     Objective:   Physical Exam  Constitutional: She is oriented to person, place, and time. She appears well-developed and well-nourished. No distress.  HENT:  Head: Normocephalic and atraumatic.  Right Ear: External ear normal. Decreased hearing is noted.  Left Ear: External ear normal. Decreased hearing is noted.  Nose: Nose normal.  Mouth/Throat: Oropharynx is clear and moist. No oropharyngeal exudate.  Eyes: Conjunctivae are normal. Pupils are equal, round, and reactive to light. Right eye exhibits no discharge. Left eye exhibits no discharge. No scleral icterus.  Neck: Normal range of motion. Neck supple. No tracheal deviation present. No thyromegaly present.  Cardiovascular: Normal rate, regular rhythm, normal heart sounds and intact distal pulses.  Exam reveals no gallop and no friction rub.   No murmur heard. Pulmonary/Chest: Effort normal and breath sounds normal. No respiratory distress. She has no wheezes. She has no rales. She exhibits no tenderness.  Musculoskeletal: Normal range of motion. She exhibits no edema and no tenderness.  Lymphadenopathy:    She has no cervical adenopathy.  Neurological: She is alert and oriented to person, place, and time. No cranial nerve deficit. She exhibits normal muscle tone. Coordination normal.  Skin: Skin is warm and dry. No  rash noted. She is not diaphoretic. No erythema. No pallor.  Psychiatric: She has a normal mood and affect. Her speech is normal and behavior is normal. Judgment and thought content normal. Cognition and memory are impaired.          Assessment & Plan:

## 2011-06-26 ENCOUNTER — Telehealth: Payer: Self-pay | Admitting: Internal Medicine

## 2011-06-26 NOTE — Telephone Encounter (Signed)
Caller: Vernona Rieger, SIC at [facility] PCP: Ronna Polio; CB#: [phone]; Calling about runny nose. Afebrile. Onset 06/25/11. Sts her roomate was dx'd w. cold. Cough is non-productive. Pt is not complaining about anything.  Colds, all ER sx's r/o. Adv'd home care per [facility] guidline and instructed to call back for worsening sxs'. Agrees.

## 2011-06-30 ENCOUNTER — Ambulatory Visit: Payer: Medicare Other | Admitting: Internal Medicine

## 2011-07-02 ENCOUNTER — Telehealth: Payer: Self-pay | Admitting: Internal Medicine

## 2011-07-02 ENCOUNTER — Encounter: Payer: Self-pay | Admitting: Internal Medicine

## 2011-07-02 ENCOUNTER — Ambulatory Visit (INDEPENDENT_AMBULATORY_CARE_PROVIDER_SITE_OTHER): Payer: Medicare Other | Admitting: Internal Medicine

## 2011-07-02 VITALS — BP 130/70 | HR 64 | Temp 96.0°F | Resp 16 | Wt 153.5 lb

## 2011-07-02 DIAGNOSIS — J40 Bronchitis, not specified as acute or chronic: Secondary | ICD-10-CM

## 2011-07-02 MED ORDER — AZITHROMYCIN 250 MG PO TABS: ORAL_TABLET | ORAL | Status: AC

## 2011-07-02 NOTE — Progress Notes (Signed)
Subjective:    Patient ID: Suzanne Mcmillan, female    DOB: [birth year], 78 y.o.   MRN: [id number]  HPI 78 year old female presents to clinic today with her daughter. Staff at the facility where she lives was concerned that the patient had wet cough and nasal congestion over the last week. Her daughter is not sure if she has had any fever. The patient denies any complaints. She reports that her nasal drainage and cough are chronic for her and unchanged. She denies any shortness of breath, chest pain, or other symptoms. She reports she is feeling well.  Outpatient Encounter Prescriptions as of 07/02/2011  Medication Sig Dispense Refill  . azithromycin (ZITHROMAX Z-PAK) 250 MG tablet Take 2 pills day 1, then 1 pill daily days 2-5  6 each  0  . Emollient (CETAPHIL) cream Apply 1 application topically 2 (two) times daily as needed.      . loperamide (IMODIUM) 2 MG capsule Take 2 mg by mouth 4 (four) times daily as needed.        . risperiDONE (RISPERDAL) 1 MG tablet Take 1 tablet by mouth at bedtime as needed for insomnia and agitation.  30 tablet  0  . sertraline (ZOLOFT) 25 MG tablet Take 1 tablet (25 mg total) by mouth daily.  30 tablet  1  . triamcinolone (KENALOG) 0.5 % cream Apply topically 2 (two) times daily.  60 g  0    Review of Systems  Constitutional: Negative for fever, chills, appetite change, fatigue and unexpected weight change.  HENT: Positive for rhinorrhea and postnasal drip. Negative for ear pain, congestion, sore throat, trouble swallowing, neck pain, voice change and sinus pressure.   Eyes: Negative for visual disturbance.  Respiratory: Positive for cough. Negative for shortness of breath, wheezing and stridor.   Cardiovascular: Negative for chest pain, palpitations and leg swelling.  Gastrointestinal: Negative for nausea, vomiting, abdominal pain, diarrhea, constipation, blood in stool, abdominal distention and anal bleeding.  Genitourinary: Negative for dysuria and flank pain.    Musculoskeletal: Negative for myalgias, arthralgias and gait problem.  Skin: Negative for color change and rash.  Neurological: Negative for dizziness and headaches.  Hematological: Negative for adenopathy. Does not bruise/bleed easily.  Psychiatric/Behavioral: Negative for suicidal ideas, sleep disturbance and dysphoric mood. The patient is not nervous/anxious.    BP 130/70  Pulse 64  Resp 16  Wt 153 lb 8 oz (69.627 kg)  SpO2 95% T 5F     Objective:   Physical Exam  Constitutional: She is oriented to person, place, and time. She appears well-developed and well-nourished. No distress.  HENT:  Head: Normocephalic and atraumatic.  Right Ear: External ear normal.  Left Ear: External ear normal.  Nose: Nose normal.  Mouth/Throat: Oropharynx is clear and moist. No oropharyngeal exudate.  Eyes: Conjunctivae are normal. Pupils are equal, round, and reactive to light. Right eye exhibits no discharge. Left eye exhibits no discharge. No scleral icterus.  Neck: Normal range of motion. Neck supple. No tracheal deviation present. No thyromegaly present.  Cardiovascular: Normal rate, regular rhythm, normal heart sounds and intact distal pulses.  Exam reveals no gallop and no friction rub.   No murmur heard. Pulmonary/Chest: Effort normal and breath sounds normal. No respiratory distress. She has no wheezes. She has no rales. She exhibits no tenderness.  Musculoskeletal: Normal range of motion. She exhibits no edema and no tenderness.  Lymphadenopathy:    She has no cervical adenopathy.  Neurological: She is alert and oriented to person,  place, and time. No cranial nerve deficit. She exhibits normal muscle tone. Coordination normal.  Skin: Skin is warm and dry. No rash noted. She is not diaphoretic. No erythema. No pallor.  Psychiatric: She has a normal mood and affect. Her behavior is normal. Judgment and thought content normal.          Assessment & Plan:

## 2011-07-02 NOTE — Assessment & Plan Note (Signed)
Symptoms of increased wet cough likely consistent with viral upper respiratory infection. However, given persistence of symptoms for one week, may have early bronchitis, so will start antibiotics. Discussed with patient's daughter. We discussed the option of waiting on antibiotics and starting treatment now. She would prefer to start now. Will treat with azithromycin x5 days. If no improvement, or if symptoms are worsening, will get chest x-ray. Patient will followup as needed and as scheduled later this month.

## 2011-07-02 NOTE — Telephone Encounter (Signed)
Caller: Danielle Dess; PCP: Ronna Polio; CB#: [phone];  Call regarding Has an appointment for tomorrow 07/03/11. She has concerns with cold sx and congestion in chest; Requesting appt be scheduled 07/02/11 d/t chest rattling per stethoscope per visiting nurse.  Seen at walk in clinic 06/28/11; was given unknown injection.  Afebrile. Notes < appetite, > sleeping.  Currently caller is helping another resident & unable to access chart; requesting appt only; triage declined.  Appt scheduled for 07/02/11 at 1545 with Dr. Dan Humphreys for caller requesting appt today, triager offered and declined per PCP Call, No Triage Guideline.

## 2011-07-02 NOTE — Telephone Encounter (Signed)
Caller: Danielle Dess; PCP: Ronna Polio; CB#: [phone];  Call regarding Has an appointment for tomorrow 07/03/11. She has concerns with cold sx and congestion in chest; Requesting appt be scheduled 07/02/11 d/t chest rattling per stethoscope per visiting nurse.  Seen at walk in clinic 06/28/11; was given unknown injection.  Afebrile. Notes < appetite, > sleeping.  CAller currently helping another resident & unable to access chart; requesting appt only; triage declined.  Appt scheduled for 07/02/11 at 1545 with Dr Dan Humphreys for caller requesting appt today, triage offered and declined per PCP Call, No Triage Guideline.

## 2011-07-03 ENCOUNTER — Ambulatory Visit: Payer: Medicare Other | Admitting: Internal Medicine

## 2011-07-10 ENCOUNTER — Ambulatory Visit: Payer: Medicare Other | Admitting: Family Medicine

## 2011-07-22 ENCOUNTER — Ambulatory Visit: Payer: Medicare Other | Admitting: Internal Medicine

## 2011-07-31 ENCOUNTER — Other Ambulatory Visit: Payer: Self-pay | Admitting: Internal Medicine

## 2011-07-31 DIAGNOSIS — F419 Anxiety disorder, unspecified: Secondary | ICD-10-CM

## 2011-07-31 MED ORDER — SERTRALINE HCL 25 MG PO TABS: 25.0000 mg | ORAL_TABLET | Freq: Every day | ORAL | Status: DC

## 2011-09-30 ENCOUNTER — Other Ambulatory Visit: Payer: Self-pay | Admitting: *Deleted

## 2011-09-30 DIAGNOSIS — F419 Anxiety disorder, unspecified: Secondary | ICD-10-CM

## 2011-09-30 MED ORDER — SERTRALINE HCL 25 MG PO TABS: 25.0000 mg | ORAL_TABLET | Freq: Every day | ORAL | Status: DC

## 2011-11-17 ENCOUNTER — Telehealth: Payer: Self-pay | Admitting: Internal Medicine

## 2011-11-17 NOTE — Telephone Encounter (Signed)
Please fax order over to nursing home fax @ [phone] [facility]

## 2011-11-18 ENCOUNTER — Other Ambulatory Visit (INDEPENDENT_AMBULATORY_CARE_PROVIDER_SITE_OTHER): Payer: Medicare Other

## 2011-11-18 DIAGNOSIS — N390 Urinary tract infection, site not specified: Secondary | ICD-10-CM

## 2011-11-18 LAB — POCT URINALYSIS DIPSTICK
Protein, UA: NEGATIVE
Spec Grav, UA: 1.015
Urobilinogen, UA: 0.2

## 2011-11-19 NOTE — Telephone Encounter (Signed)
No order faxed, patients urinalysis was negative.

## 2011-12-25 ENCOUNTER — Encounter (HOSPITAL_COMMUNITY): Payer: Self-pay | Admitting: *Deleted

## 2011-12-25 ENCOUNTER — Inpatient Hospital Stay (HOSPITAL_COMMUNITY)
Admission: AD | Admit: 2011-12-25 | Discharge: 2011-12-28 | DRG: 090 | Disposition: A | Discharge status: Nursing Home (Includes ICF) | Payer: Medicare Other | Source: Ambulatory Visit | Attending: General Surgery | Admitting: General Surgery

## 2011-12-25 ENCOUNTER — Emergency Department: Payer: Self-pay | Admitting: Emergency Medicine

## 2011-12-25 DIAGNOSIS — S060XAA Concussion with loss of consciousness status unknown, initial encounter: Secondary | ICD-10-CM | POA: Diagnosis present

## 2011-12-25 DIAGNOSIS — S060X9A Concussion with loss of consciousness of unspecified duration, initial encounter: Secondary | ICD-10-CM | POA: Diagnosis present

## 2011-12-25 DIAGNOSIS — Z806 Family history of leukemia: Secondary | ICD-10-CM

## 2011-12-25 DIAGNOSIS — F411 Generalized anxiety disorder: Secondary | ICD-10-CM | POA: Diagnosis present

## 2011-12-25 DIAGNOSIS — F3289 Other specified depressive episodes: Secondary | ICD-10-CM | POA: Diagnosis present

## 2011-12-25 DIAGNOSIS — I10 Essential (primary) hypertension: Secondary | ICD-10-CM | POA: Diagnosis present

## 2011-12-25 DIAGNOSIS — Z79899 Other long term (current) drug therapy: Secondary | ICD-10-CM

## 2011-12-25 DIAGNOSIS — G309 Alzheimer's disease, unspecified: Secondary | ICD-10-CM | POA: Diagnosis present

## 2011-12-25 DIAGNOSIS — F419 Anxiety disorder, unspecified: Secondary | ICD-10-CM

## 2011-12-25 DIAGNOSIS — Z800 Family history of malignant neoplasm of digestive organs: Secondary | ICD-10-CM

## 2011-12-25 DIAGNOSIS — Y9210 Unspecified residential institution as the place of occurrence of the external cause: Secondary | ICD-10-CM | POA: Diagnosis present

## 2011-12-25 DIAGNOSIS — Z8261 Family history of arthritis: Secondary | ICD-10-CM

## 2011-12-25 DIAGNOSIS — F329 Major depressive disorder, single episode, unspecified: Secondary | ICD-10-CM | POA: Diagnosis present

## 2011-12-25 DIAGNOSIS — F0280 Dementia in other diseases classified elsewhere without behavioral disturbance: Secondary | ICD-10-CM | POA: Diagnosis present

## 2011-12-25 DIAGNOSIS — G4700 Insomnia, unspecified: Secondary | ICD-10-CM | POA: Diagnosis present

## 2011-12-25 DIAGNOSIS — Z8249 Family history of ischemic heart disease and other diseases of the circulatory system: Secondary | ICD-10-CM

## 2011-12-25 DIAGNOSIS — W06XXXA Fall from bed, initial encounter: Secondary | ICD-10-CM | POA: Diagnosis present

## 2011-12-25 DIAGNOSIS — Z880 Allergy status to penicillin: Secondary | ICD-10-CM

## 2011-12-25 DIAGNOSIS — W19XXXA Unspecified fall, initial encounter: Secondary | ICD-10-CM | POA: Diagnosis present

## 2011-12-25 DIAGNOSIS — S060X0A Concussion without loss of consciousness, initial encounter: Principal | ICD-10-CM | POA: Diagnosis present

## 2011-12-25 LAB — COMPREHENSIVE METABOLIC PANEL
Albumin: 3.5 g/dL (ref 3.4–5.0)
Alkaline Phosphatase: 92 U/L (ref 50–136)
Bilirubin,Total: 0.6 mg/dL (ref 0.2–1.0)
Co2: 24 mmol/L (ref 21–32)
Creatinine: 1.05 mg/dL (ref 0.60–1.30)
EGFR (Non-African Amer.): 45 — ABNORMAL LOW
Potassium: 3.9 mmol/L (ref 3.5–5.1)
Sodium: 142 mmol/L (ref 136–145)

## 2011-12-25 LAB — CBC WITH DIFFERENTIAL/PLATELET
Eosinophil %: 3.1 %
Lymphocyte #: 1.6 10*3/uL (ref 1.0–3.6)
MCH: 32.1 pg (ref 26.0–34.0)
MCV: 97 fL (ref 80–100)
Monocyte #: 0.6 x10 3/mm (ref 0.2–0.9)
Neutrophil #: 5.8 10*3/uL (ref 1.4–6.5)
Platelet: 202 10*3/uL (ref 150–440)
RBC: 4.28 10*6/uL (ref 3.80–5.20)

## 2011-12-25 LAB — URINALYSIS, COMPLETE
Bilirubin,UR: NEGATIVE
Blood: NEGATIVE
Ketone: NEGATIVE
Nitrite: NEGATIVE
Protein: NEGATIVE
Specific Gravity: 1.008 (ref 1.003–1.030)

## 2011-12-25 LAB — MRSA PCR SCREENING: MRSA by PCR: NEGATIVE

## 2011-12-25 MED ORDER — RISPERIDONE 0.5 MG PO TABS
0.5000 mg | ORAL_TABLET | Freq: Every day | ORAL | Status: DC
  Administered 2011-12-25 – 2011-12-27 (×3): 0.5 mg via ORAL
  Filled 2011-12-25 (×4): qty 1

## 2011-12-25 MED ORDER — CETAPHIL MOISTURIZING EX CREA: 1.0000 "application " | TOPICAL_CREAM | Freq: Two times a day (BID) | CUTANEOUS | Status: DC | PRN

## 2011-12-25 MED ORDER — SERTRALINE HCL 25 MG PO TABS
25.0000 mg | ORAL_TABLET | Freq: Every day | ORAL | Status: DC
  Administered 2011-12-25 – 2011-12-28 (×4): 25 mg via ORAL
  Filled 2011-12-25 (×4): qty 1

## 2011-12-25 MED ORDER — TRAMADOL HCL 50 MG PO TABS
25.0000 mg | ORAL_TABLET | Freq: Four times a day (QID) | ORAL | Status: DC | PRN
  Administered 2011-12-27: 50 mg via ORAL
  Filled 2011-12-25: qty 1

## 2011-12-25 MED ORDER — ONDANSETRON HCL 4 MG/2ML IJ SOLN: 4.0000 mg | Freq: Four times a day (QID) | INTRAMUSCULAR | Status: DC | PRN

## 2011-12-25 MED ORDER — CETAPHIL MOISTURIZING EX LOTN
TOPICAL_LOTION | Freq: Two times a day (BID) | CUTANEOUS | Status: DC | PRN
  Filled 2011-12-25: qty 473

## 2011-12-25 MED ORDER — INFLUENZA VIRUS VACC SPLIT PF IM SUSP
0.5000 mL | INTRAMUSCULAR | Status: AC
  Administered 2011-12-27: 0.5 mL via INTRAMUSCULAR
  Filled 2011-12-25: qty 0.5

## 2011-12-25 MED ORDER — SODIUM CHLORIDE 0.45 % IV SOLN
INTRAVENOUS | Status: DC
  Administered 2011-12-25: 17:00:00 via INTRAVENOUS

## 2011-12-25 MED ORDER — ENOXAPARIN SODIUM 40 MG/0.4ML ~~LOC~~ SOLN
40.0000 mg | SUBCUTANEOUS | Status: DC
  Administered 2011-12-25 – 2011-12-27 (×3): 40 mg via SUBCUTANEOUS
  Filled 2011-12-25 (×4): qty 0.4

## 2011-12-25 MED ORDER — ONDANSETRON HCL 4 MG PO TABS
4.0000 mg | ORAL_TABLET | Freq: Four times a day (QID) | ORAL | Status: DC | PRN
  Filled 2011-12-25: qty 1

## 2011-12-25 NOTE — Progress Notes (Signed)
Suzanne Mcmillan [id number] Admission Data: 12/25/2011 3:34 PM Attending Provider: Trauma Md, MD  UXL:[id number],UVOZDGUY A, MD Consults/ Treatment Team:    Suzanne Mcmillan is a 78 y.o. female patient admitted from ED awake, alert  & orientated  X 3,  No Order, VSS - Height 5\' 7"  (1.702 m), weight 70 kg (154 lb 5.2 oz).,, no c/o shortness of breath, no c/o chest pain, no distress noted. Tele10 ** placed and pt is currently running:normal sinus rhythm.   IV site WDL:  forearm left, condition patent and no redness with a transparent dsg that's clean dry and intact.  Allergies:   Allergies  Allergen Reactions  . Penicillins     REACTION: Rash, redness     Past Medical History  Diagnosis Date  . Depression   . Hypertension   . Dementia   . Allergy     Hay fever    History:  obtained from the patient. Tobacco/alcohol: denied none  Pt orientation to unit, room and routine. Information packet given to patient/family and safety video watched.  Admission INP armband ID verified with patient/family, and in place. SR up x 2, fall risk assessment complete with Patient and family verbalizing understanding of risks associated with falls. Pt verbalizes an understanding of how to use the call bell and to call for help before getting out of bed.  Skin, clean-dry- intact without evidence of bruising, or skin tears.   No evidence of skin break down noted on exam. ecchymoses - scattered    Will cont to monitor and assist as needed.  Suzanne Pickron Consuella Lose, RN 12/25/2011 3:34 PM

## 2011-12-25 NOTE — H&P (Signed)
On review of her facility web site, it appears to offer assisted living level only.  Will treat for concussion and have therapies see.  Will likely need SNF placement next week. Patient examined and I agree with the assessment and plan  Violeta Gelinas, MD, MPH, FACS Pager: [phone]  12/25/2011 3:24 PM

## 2011-12-25 NOTE — H&P (Signed)
Suzanne Mcmillan is an 78 y.o. female.   Chief Complaint: Fall HPI: Suzanne Mcmillan resides at a SNF. She says that she tried to get up by herself, even though she knows she's not supposed to, and fell. She denies LOC. She was evaluated at [facility] ED and had a negative head CT. However, she continued to have symptoms of headache and nausea that the medical staff were not comfortable taking care of. She was transferred to [facility] for further evaluation. Here she denies headache and says her nausea has improved.  Past Medical History  Diagnosis Date  . Depression   . Hypertension   . Dementia   . Allergy     Hay fever    Past Surgical History  Procedure Date  . Tonsillectomy 1938  . Abdominal hysterectomy 1968  . Spine surgery 1983    ruptured disc lumbar  . Eye surgery 1992    cataract removal  . Breast surgery 1995    lumpectomy-left-benign    Family History  Problem Relation Age of Onset  . Leukemia Mother   . Heart disease Sister   . Cancer Brother 87    stomach cancer  . Cancer Daughter     breast  . Arthritis Brother     severe rheumatoid arthritis  . Heart disease Sister    Social History:  reports that she has never smoked. She does not have any smokeless tobacco history on file. She reports that she does not drink alcohol or use illicit drugs.  Allergies:  Allergies  Allergen Reactions  . Penicillins     REACTION: Rash, redness    Medications Prior to Admission  Medication Sig Dispense Refill  . Emollient (CETAPHIL) cream Apply 1 application topically 2 (two) times daily as needed.      . loperamide (IMODIUM) 2 MG capsule Take 2 mg by mouth 4 (four) times daily as needed.        . risperiDONE (RISPERDAL) 1 MG tablet Take 1 tablet by mouth at bedtime as needed for insomnia and agitation.  30 tablet  0  . sertraline (ZOLOFT) 25 MG tablet Take 1 tablet (25 mg total) by mouth daily.  30 tablet  3  . triamcinolone (KENALOG) 0.5 % cream Apply topically 2 (two) times daily.  60  g  0     Review of Systems  : mental acuity  Gastrointestinal: Negative for nausea and vomiting.  Neurological: Negative for headaches.    Height 5\' 7"  (1.702 m), weight 154 lb 5.2 oz (70 kg). Physical Exam  Constitutional: She appears well-developed and well-nourished. No distress.  HENT:  Head: Normocephalic and atraumatic.  Right Ear: Decreased hearing is noted.  Left Ear: Decreased hearing is noted.  Nose: Nose normal.  Mouth/Throat: Oropharynx is clear and moist. No oropharyngeal exudate.  Eyes: Conjunctivae normal and EOM are normal. Pupils are equal, round, and reactive to light. Right eye exhibits no discharge. Left eye exhibits no discharge. No scleral icterus.  Neck: Normal range of motion. Neck supple. No tracheal deviation present. No thyromegaly present.  Cardiovascular: Normal rate, regular rhythm, normal heart sounds and intact distal pulses.  Exam reveals no gallop.   No murmur heard. Respiratory: Effort normal and breath sounds normal. No stridor. No respiratory distress. She has no wheezes. She has no rales. She exhibits no tenderness.  GI: Soft. Bowel sounds are normal. She exhibits no distension. There is no tenderness. There is no rebound and no guarding.  Musculoskeletal: She exhibits no edema  and no tenderness.  Lymphadenopathy:    She has no cervical adenopathy.  Neurological: She is alert.  Skin: Skin is warm and dry. She is not diaphoretic.  Psychiatric: She has a normal mood and affect.     Assessment/Plan Fall Concussion  Admit to trauma for supportive care.   Freeman Caldron, PA-C Pager: [phone] General Trauma PA Pager: [phone]  12/25/2011, 3:04 PM

## 2011-12-25 NOTE — Progress Notes (Signed)
UR completed 

## 2011-12-26 DIAGNOSIS — I10 Essential (primary) hypertension: Secondary | ICD-10-CM

## 2011-12-26 NOTE — Evaluation (Signed)
Physical Therapy Evaluation Patient Details Name: Suzanne Mcmillan MRN: [id number] DOB: Jun 24, 1915 Today's Date: 12/26/2011 Time: 4098-1191 PT Time Calculation (min): 22 min  PT Assessment / Plan / Recommendation Clinical Impression  Pt s/p fall at ALF with concussion. Pt currently presenting with decreased mobility and increased pain from fall. Spoke with tech at ALF who states that they are able to provide assistance if needed and will bring in HHPT. If ALF is unable to provide pt with the amount of assistance that she needs, will plan to d/c to SNF    PT Assessment  Patient needs continued PT services    Follow Up Recommendations  Home health PT;Other (comment);Supervision/Assistance - 24 hour (ALF with HHPT)          Equipment Recommendations  None recommended by PT    Recommendations for Other Services     Frequency Min 3X/week    Precautions / Restrictions Precautions Precautions: Fall Restrictions Weight Bearing Restrictions: No   Pertinent Vitals/Pain Pt with pain 9/10 in back with all movements      Mobility  Bed Mobility Bed Mobility: Supine to Sit;Sitting - Scoot to Edge of Bed;Sit to Supine Supine to Sit: 3: Mod assist;With rails;HOB elevated Sitting - Scoot to Edge of Bed: 1: +1 Total assist Sit to Supine: 4: Min assist Details for Bed Mobility Assistance: VC for sequencing. Max cueing needed for motivation into sitting as pt wincing in pain during all mobility.  Transfers Transfers: Sit to Stand;Stand to Sit Sit to Stand: 2: Max assist;With upper extremity assist;From bed Stand to Sit: 2: Max assist;With upper extremity assist;To bed Details for Transfer Assistance: Max assist for standing secondary to pt maintaining posterior lean and forward trunk. Cueing for upright standing, pt unable to follow commands.  Ambulation/Gait Ambulation/Gait Assistance: Not tested (comment)           PT Diagnosis: Difficulty walking;Acute pain  PT Problem List: Decreased  activity tolerance;Decreased balance;Decreased mobility;Decreased knowledge of use of DME;Decreased cognition;Decreased knowledge of precautions;Decreased safety awareness;Pain PT Treatment Interventions: DME instruction;Gait training;Functional mobility training;Therapeutic activities;Patient/family education   PT Goals Acute Rehab PT Goals PT Goal Formulation: Patient unable to participate in goal setting Time For Goal Achievement: 01/09/12 Potential to Achieve Goals: Fair Pt will go Supine/Side to Sit: with modified independence PT Goal: Supine/Side to Sit - Progress: Goal set today Pt will go Sit to Supine/Side: with modified independence PT Goal: Sit to Supine/Side - Progress: Goal set today Pt will go Sit to Stand: with min assist PT Goal: Sit to Stand - Progress: Goal set today Pt will go Stand to Sit: with min assist PT Goal: Stand to Sit - Progress: Goal set today Pt will Transfer Bed to Chair/Chair to Bed: with min assist PT Transfer Goal: Bed to Chair/Chair to Bed - Progress: Goal set today Pt will Ambulate: 51 - 150 feet;with min assist;with least restrictive assistive device PT Goal: Ambulate - Progress: Goal set today  Visit Information  Last PT Received On: 12/26/11 Assistance Needed: +2    Subjective Data  Patient Stated Goal: no goal stated   Prior Functioning  Home Living Lives With: Other (Comment) (ALF) Available Help at Discharge: Other (Comment);Available 24 hours/day (ALF) Type of Home: Assisted living Additional Comments: Pt lives at [facility]. She has 24/7 supervision Prior Function Level of Independence: Needs assistance Needs Assistance: Bathing Bath: Minimal Comments: Spoke with Sadiqa at ALF, states that pt has been ambulating independently throughout ALF, although has supervision 24/7. Pt has limited  assistance with bathing and dresses independently with direction only. Assistance is available if needed Communication Communication:  HOH Dominant Hand: Right    Cognition  Overall Cognitive Status: History of cognitive impairments - at baseline Arousal/Alertness: Awake/alert Orientation Level: Disoriented to;Place;Time;Situation Behavior During Session: WFL for tasks performed    Extremity/Trunk Assessment Right Lower Extremity Assessment RLE ROM/Strength/Tone: Within functional levels RLE Sensation: WFL - Light Touch Left Lower Extremity Assessment LLE ROM/Strength/Tone: Within functional levels LLE Sensation: WFL - Light Touch   Balance    End of Session PT - End of Session Equipment Utilized During Treatment: Gait belt Activity Tolerance: Patient limited by pain Patient left: in bed;with call bell/phone within reach;with bed alarm set;with nursing in room Nurse Communication: Mobility status    Milana Kidney 12/26/2011, 11:24 AM  12/26/2011 Milana Kidney DPT PAGER: [phone] OFFICE: [phone]

## 2011-12-26 NOTE — Progress Notes (Signed)
  Subjective: No events. Pt has no c/o. "feel fine"  Objective: Vital signs in last 24 hours: Temp:  [97.8 F (36.6 C)-98.4 F (36.9 C)] 98 F (36.7 C) (10/26 0600) Pulse Rate:  [64-93] 69  (10/26 1015) Resp:  [18] 18  (10/26 1015) BP: (138-177)/(43-96) 138/43 mmHg (10/26 1015) SpO2:  [96 %-100 %] 99 % (10/26 1015) Weight:  [154 lb 5.2 oz (70 kg)] 154 lb 5.2 oz (70 kg) (10/25 1459) Last BM Date: 12/25/11  Intake/Output from previous day: 10/25 0701 - 10/26 0700 In: 140 [I.V.:140] Out: -  Intake/Output this shift:    Awake, alert, demented MAE, FC x 4 Bruise behind L ear Oriented to state only cta Reg Soft, nt, nd No edema   Lab Results:  No results found for this basename: WBC:2,HGB:2,HCT:2,PLT:2 in the last 72 hours BMET No results found for this basename: NA:2,K:2,CL:2,CO2:2,GLUCOSE:2,BUN:2,CREATININE:2,CALCIUM:2 in the last 72 hours PT/INR No results found for this basename: LABPROT:2,INR:2 in the last 72 hours ABG No results found for this basename: PHART:2,PCO2:2,PO2:2,HCO3:2 in the last 72 hours  Studies/Results: No results found.  Anti-infectives: Anti-infectives    None      Assessment/Plan: s/p * No surgery found * Patient Active Problem List  Diagnosis  . Alzheimer disease  . HYPERTENSION  . Fall  . Concussion   Cont PT/OT Diet as tolerated Vitals stable. Neuro exam stable Check labs in am Will need placement  Mary Sella. Andrey Campanile, MD, FACS General, Bariatric, & Minimally Invasive Surgery [facility], [place]   LOS: 1 day    Atilano Ina 12/26/2011

## 2011-12-26 NOTE — Evaluation (Signed)
Occupational Therapy Evaluation Patient Details Name: Suzanne Mcmillan MRN: [id number] DOB: Feb 17, 1916 Today's Date: 12/26/2011 Time: 4098-1191 OT Time Calculation (min): 24 min  OT Assessment / Plan / Recommendation Clinical Impression  Pt admitted after fall at ALF resulting in a concussion. Pt has baseline dementia. Pt now presents with back pain and decreased mobility.  PLOF obtained from caregiver at ALF.  Staff is able to provide a higher level of physical assist than was necessary PTA.  Recommend return to ALF and HHOT.    OT Assessment  Patient needs continued OT Services    Follow Up Recommendations  Other (comment);Supervision/Assistance - 24 hour (ALF)    Barriers to Discharge      Equipment Recommendations  None recommended by OT    Recommendations for Other Services    Frequency  Min 2X/week    Precautions / Restrictions Precautions Precautions: Fall Restrictions Weight Bearing Restrictions: No   Pertinent Vitals/Pain Back, did not rate, repositioned    ADL  Eating/Feeding: Performed;Set up Where Assessed - Eating/Feeding: Bed level Grooming: Simulated;Minimal assistance Where Assessed - Grooming: Unsupported sitting Upper Body Bathing: Simulated;Minimal assistance Where Assessed - Upper Body Bathing: Unsupported sitting Lower Body Bathing: Simulated;+2 Total assistance Lower Body Bathing: Patient Percentage: 0% Where Assessed - Lower Body Bathing: Supported sit to stand Upper Body Dressing: Simulated;Minimal assistance Where Assessed - Upper Body Dressing: Unsupported sitting Lower Body Dressing: Simulated;+2 Total assistance Lower Body Dressing: Patient Percentage: 0% Where Assessed - Lower Body Dressing: Supported sit to stand Equipment Used: Gait belt    OT Diagnosis: Generalized weakness;Cognitive deficits;Other (comment) (chronic back pain)  OT Problem List: Decreased cognition;Pain;Decreased strength;Decreased activity tolerance;Impaired balance  (sitting and/or standing) OT Treatment Interventions: Self-care/ADL training;Therapeutic activities;Patient/family education   OT Goals Acute Rehab OT Goals OT Goal Formulation: With patient Time For Goal Achievement: 01/09/12 Potential to Achieve Goals: Good ADL Goals Pt Will Perform Grooming: Standing at sink;with min assist ADL Goal: Grooming - Progress: Goal set today Pt Will Perform Upper Body Bathing: with supervision;Sitting, edge of bed ADL Goal: Upper Body Bathing - Progress: Goal set today Pt Will Perform Lower Body Bathing: with mod assist;Sit to stand from bed ADL Goal: Lower Body Bathing - Progress: Goal set today Pt Will Perform Upper Body Dressing: with supervision;Sitting, bed ADL Goal: Upper Body Dressing - Progress: Goal set today Pt Will Perform Lower Body Dressing: with mod assist;Sit to stand from bed ADL Goal: Lower Body Dressing - Progress: Goal set today Pt Will Transfer to Toilet: with min assist;Ambulation;with DME;Comfort height toilet ADL Goal: Toilet Transfer - Progress: Goal set today Pt Will Perform Toileting - Clothing Manipulation: Sitting on 3-in-1 or toilet;with min assist ADL Goal: Toileting - Clothing Manipulation - Progress: Goal set today Pt Will Perform Toileting - Hygiene: with supervision;Sitting on 3-in-1 or toilet ADL Goal: Toileting - Hygiene - Progress: Goal set today Miscellaneous OT Goals Miscellaneous OT Goal #1: Pt will perform bed mobility with supervision in prep for ADL at EOB. OT Goal: Miscellaneous Goal #1 - Progress: Goal set today  Visit Information  Last OT Received On: 12/26/11 Assistance Needed: +2 PT/OT Co-Evaluation/Treatment: Yes    Subjective Data  Subjective: " Ow, my back hurts."   Prior Functioning     Home Living Lives With: Other (Comment) (ALF) Available Help at Discharge: Other (Comment);Available 24 hours/day Type of Home: Assisted living Additional Comments: Pt lives at [facility]. She has 24/7  supervision Prior Function Level of Independence: Needs assistance Needs Assistance: Bathing Bath: Minimal  Driving: No Comments: Spoke with Sadiqa at ALF, states that pt has been ambulating independently throughout ALF, although has supervision 24/7. Pt has limited assistance with bathing and dresses independently with direction only. Assistance is available if needed Communication Communication: HOH Dominant Hand: Right         Vision/Perception     Cognition  Overall Cognitive Status: History of cognitive impairments - at baseline Arousal/Alertness: Awake/alert Orientation Level: Disoriented to;Place;Time;Situation Behavior During Session: [facility] for tasks performed    Extremity/Trunk Assessment Right Upper Extremity Assessment RUE ROM/Strength/Tone: WFL for tasks assessed RUE Sensation: WFL - Light Touch;WFL - Proprioception Left Upper Extremity Assessment LUE ROM/Strength/Tone: WFL for tasks assessed LUE Sensation: WFL - Light Touch;WFL - Proprioception Right Lower Extremity Assessment RLE ROM/Strength/Tone: Within functional levels RLE Sensation: WFL - Light Touch Left Lower Extremity Assessment LLE ROM/Strength/Tone: Within functional levels LLE Sensation: WFL - Light Touch     Mobility Bed Mobility Bed Mobility: Supine to Sit;Sitting - Scoot to Edge of Bed;Sit to Supine Supine to Sit: 3: Mod assist;With rails;HOB elevated Sitting - Scoot to Edge of Bed: 1: +1 Total assist Sit to Supine: 4: Min assist Details for Bed Mobility Assistance: VC for sequencing. Max cueing needed for motivation into sitting as pt wincing in pain during all mobility.  Transfers Transfers: Sit to Stand;Stand to Sit Sit to Stand: 2: Max assist;With upper extremity assist;From bed Stand to Sit: 2: Max assist;With upper extremity assist;To bed Details for Transfer Assistance: Max assist for standing secondary to pt maintaining posterior lean and forward trunk. Cueing for upright standing, pt  unable to follow commands.      Shoulder Instructions     Exercise     Balance     End of Session OT - End of Session Activity Tolerance: Patient limited by pain Patient left: in bed;with call bell/phone within reach;with bed alarm set Nurse Communication: Mobility status  GO     Evern Bio 12/26/2011, 11:38 AM [phone]

## 2011-12-27 LAB — BASIC METABOLIC PANEL
Calcium: 8.4 mg/dL (ref 8.4–10.5)
Creatinine, Ser: 0.86 mg/dL (ref 0.50–1.10)
GFR calc Af Amer: 65 mL/min — ABNORMAL LOW (ref >90)
GFR calc non Af Amer: 56 mL/min — ABNORMAL LOW (ref >90)

## 2011-12-27 LAB — CBC WITH DIFFERENTIAL/PLATELET
Basophils Absolute: 0 10*3/uL (ref 0.0–0.1)
Eosinophils Relative: 5 % (ref 0–5)
Lymphocytes Relative: 15 % (ref 12–46)
Neutro Abs: 4.7 10*3/uL (ref 1.7–7.7)
Neutrophils Relative %: 66 % (ref 43–77)
Platelets: 184 10*3/uL (ref 150–400)
RDW: 14.2 % (ref 11.5–15.5)
WBC: 7.1 10*3/uL (ref 4.0–10.5)

## 2011-12-27 NOTE — Progress Notes (Signed)
Clinical Social Work Department BRIEF PSYCHOSOCIAL ASSESSMENT 12/27/2011  Patient:  RAMANI, SENN     Account Number:  [id number]     Admit date:  12/25/2011  Clinical Social Worker: Johnsie Cancel,  Date/Time:  12/27/2011 08:41 AM  Referred by:  Physician  Date Referred:  12/26/2011 Referred for  ALF Placement   Other Referral:   Interview type:  Family Other interview type:    PSYCHOSOCIAL DATA Living Status:  FACILITY Admitted from facility:  OTHER Level of care:  Assisted Living Primary support name:  June Degree of support available:   Adequate    CURRENT CONCERNS Current Concerns  Post-Acute Placement   Other Concerns:    SOCIAL WORK ASSESSMENT / PLAN CSW consulted by MD re: post acute placement for the patient. The patient had an altered mental status at the time of assessment, and this CSW contacted the patient's [facility], June ([phone]). The patient is from [facility], [facility] in [place]. According to the patient's HCPOA she will return at discharge. CSW will contact the ALF to inform of d/c plan and the weekday CSW will continue to follow the patient.   Assessment/plan status:  Information/Referral to [facility] Other assessment/ plan:   Information/referral to community resources:    PATIENT'S/FAMILY'S RESPONSE TO PLAN OF CARE: HCPOA thanked CSW for facilitating discharge and providing support.   Lia Foyer, LCSWA [facility] Clinical Social Worker Contact #: [phone] (weekend)

## 2011-12-27 NOTE — Progress Notes (Signed)
Agree with assessment.  Await placement

## 2011-12-27 NOTE — Progress Notes (Signed)
Patient ID: Suzanne Mcmillan, female   DOB: [birth year], 78 y.o.   MRN: [id number]    Subjective: Patient offers no c/o this morning.  Objective: Vital signs in last 24 hours: Temp:  [98 F (36.7 C)-98.5 F (36.9 C)] 98.5 F (36.9 C) (10/27 0600) Pulse Rate:  [69-87] 75  (10/27 0600) Resp:  [18-20] 20  (10/27 0600) BP: (125-171)/(41-77) 171/63 mmHg (10/27 0600) SpO2:  [95 %-99 %] 96 % (10/27 0600) Last BM Date: 12/26/11  Intake/Output from previous day: BM X4 UOP X 4 No volumes recorded   Intake/Output this shift: IVF   General appearance: Awake, alert, demented, not orientated to time or place. HENT: Bruise behind L ear Chest: CTA Cardiac: RRR Abdomen: Soft, non tender non distended Extremities: + Pulses bilaterally warm to touch moving all four equally, No edema WBC wnl, H&H stable. VSS, afebrile   Lab Results:   [facility] 12/27/11 0535  WBC 7.1  HGB 12.3  HCT 36.5  PLT 184   BMET  Basename 12/27/11 0535  NA 136  K 3.5  CL 105  CO2 22  GLUCOSE 107*  BUN 15  CREATININE 0.86  CALCIUM 8.4   PT/INR No results found for this basename: LABPROT:2,INR:2 in the last 72 hours ABG No results found for this basename: PHART:2,PCO2:2,PO2:2,HCO3:2 in the last 72 hours  Studies/Results: No results found.  Anti-infectives: Anti-infectives    None      Assessment/Plan: s/p * No surgery found * Patient Active Problem List  Diagnosis  . Alzheimer disease  . HYPERTENSION  . Fall  . Concussion   Cont PT/OT Neuro checks Diet as tolerated (aspiration risk?) Will need placement  [facility] Surgery   LOS: 2 days    Blenda Mounts 12/27/2011

## 2011-12-27 NOTE — Progress Notes (Addendum)
CSW received a phone call from [facility] to discuss physical therapy recommendation: SNF placement. HCPOA, June ([phone]) stated the facility has a physical therapist on site and would prefer her to go back to [facility]. HCPOA provided CSW with [facility]'s nurse manager's phone number, Liborio Nixon [phone] to confirm PT and 24/7 supervision would be provided.  Liborio Nixon stated she would contact weekday CSW to assess patient's current needs.  Lia Foyer, LCSWA [facility] Clinical Social Worker Contact #: [phone] (weekend)

## 2011-12-28 MED ORDER — TRAMADOL HCL 50 MG PO TABS: 25.0000 mg | ORAL_TABLET | Freq: Four times a day (QID) | ORAL | Status: DC | PRN

## 2011-12-28 NOTE — Clinical Social Work Note (Signed)
Clinical Social Worker continuing to follow patient for support and discharge planning needs.  Patient is currently a resident at [facility] in [place], [place] and per family would like to return.  CSW spoke to facility who agreed to have patient return.  CSW spoke with patient daughter who is agreeable with patient return and requested transportation be arranged by ambulance to [facility].    Due to patient altered mental status and alzheimer's diagnosis, patient is not an appropriate candidate for SBIRT assessment.  No SBIRT completed for this reason.  Clinical Social Worker will sign off for now as social work intervention is no longer needed. Please consult [place] again if new need arises.  Macario Golds, [place] [phone]

## 2011-12-28 NOTE — Progress Notes (Signed)
Patient discharged to ALF, discharge ib\nstructions given , awaiting on PTAR for transportation

## 2011-12-28 NOTE — Progress Notes (Signed)
Pt discharging today to [facility].  [facility] provides the [facility] services to this facility.  Called [facility] rep for [facility] and gave her the information.  She said she would come to the hospital at 1600 and pick up necessary patient records and did not need to have them faxed to her.  PA is doing d/c summary and orders now.

## 2011-12-28 NOTE — Progress Notes (Addendum)
Physical Therapy Treatment Patient Details Name: Suzanne Mcmillan MRN: [id number] DOB: 1915-08-13 Today's Date: 12/28/2011 Time: 4098-1191 PT Time Calculation (min): 28 min  PT Assessment / Plan / Recommendation Comments on Treatment Session  Pt's ability to follow commands and participate limited by dementia. Did require +2totalA pt performing 40-60% for ambulation and transfers however improved since evaluation. Pt with poor judgement and impulsive. Left messages for both Liborio Nixon Psychiatrist at [facility]) and Falkner with [facility] to discuss how patient performed today with therapy as they are trying to determine if she can return to ALF vs needing SNF.     Follow Up Recommendations  Home health PT;Post acute inpatient     Does the patient have the potential to tolerate intense rehabilitation  No, Recommend SNF vs HHPT at ALF with 24 hour assist  Barriers to Discharge        Equipment Recommendations  None recommended by PT    Recommendations for Other Services    Frequency Min 3X/week   Plan Discharge plan remains appropriate    Precautions / Restrictions Precautions Precautions: Fall Restrictions Weight Bearing Restrictions: No       Mobility  Bed Mobility Bed Mobility: Sitting - Scoot to Edge of Bed Supine to Sit: 3: Mod assist Sitting - Scoot to Edge of Bed: 2: Max assist Details for Bed Mobility Assistance: needing more assist because of confusion and difficulty following commands, needing continuous redirection to task Transfers Transfers: Sit to Stand;Stand to Sit;Stand Pivot Transfers Sit to Stand: 1: +2 Total assist;From bed;From chair/3-in-1;With upper extremity assist Sit to Stand: Patient Percentage: 50% Stand to Sit: 1: +2 Total assist;To chair/3-in-1;With upper extremity assist Stand to Sit: Patient Percentage: 40% Stand Pivot Transfers: 1: +2 Total assist Stand Pivot Transfers: Patient Percentage: 40% Details for Transfer Assistance: pt needing mod  facilitation bilaterally to come to full standing as well as to stabilize RW as pt pulling on it to stand, when turning to sit in the chair pt trying to sit prior to chair being behind her needing modA to prevent her fromt sitting on the floor and to guide her hips to chair  Ambulation/Gait Ambulation/Gait Assistance: 1: +2 Total assist Ambulation/Gait: Patient Percentage: 60% Ambulation Distance (Feet): 8 Feet Assistive device: Rolling walker Ambulation/Gait Assistance Details: ambulated approx 4 ft x2 to transfer to chair and to 3in1 as pt incontinent with urine and bowel with no idea, facilitation bilaterally to negotiate RW and for safe technique/tall posture Gait Pattern: Trunk flexed;Shuffle Stairs: No      PT Goals Acute Rehab PT Goals Pt will go Supine/Side to Sit: with min assist PT Goal: Supine/Side to Sit - Progress: Revised due to lack of progress Pt will go Sit to Supine/Side: with min assist PT Goal: Sit to Supine/Side - Progress: Revised due to lack of progress PT Goal: Sit to Stand - Progress: Progressing toward goal PT Goal: Stand to Sit - Progress: Progressing toward goal PT Transfer Goal: Bed to Chair/Chair to Bed - Progress: Progressing toward goal PT Goal: Ambulate - Progress: Progressing toward goal  Visit Information  Last PT Received On: 12/28/11 Assistance Needed: +2    Subjective Data  Subjective: "can I lay back down?" Patient Stated Goal: confused   Cognition  Overall Cognitive Status: History of cognitive impairments - at baseline Arousal/Alertness: Awake/alert Orientation Level: Disoriented X4;Place;Time;Situation Behavior During Session: Agitated Cognition - Other Comments: Difficult to get pt to participate because of confusion, needing constant redirection as she was very focused on  lying down, poor STM     Balance     End of Session PT - End of Session Equipment Utilized During Treatment: Gait belt Activity Tolerance: Treatment limited  secondary to agitation Patient left: in chair;with call bell/phone within reach Nurse Communication: Mobility status   GP     [facility] HELEN 12/28/2011, 1:07 PM

## 2011-12-28 NOTE — Progress Notes (Signed)
Patient ID: Suzanne Mcmillan, female   DOB: [birth year], 78 y.o.   MRN: [id number]   LOS: 3 days   Subjective: No c/o.  Objective: Vital signs in last 24 hours: Temp:  [97.5 F (36.4 C)-98.5 F (36.9 C)] 97.5 F (36.4 C) (10/28 0600) Pulse Rate:  [66-78] 73  (10/28 0600) Resp:  [18-20] 20  (10/27 2144) BP: (135-172)/(62-82) 172/74 mmHg (10/28 0600) SpO2:  [95 %-99 %] 95 % (10/28 0600) Last BM Date: 12/27/11   General appearance: alert and no distress Neck: No TTP Resp: clear to auscultation bilaterally Cardio: regular rate and rhythm   Assessment/Plan: Fall Concussion FEN -- No issues VTE -- SCD's Dispo -- Likely home today as it appears ALF can provide PT and 24h supervision   Freeman Caldron, PA-C Pager: [phone] General Trauma PA Pager: [phone]   12/28/2011

## 2011-12-28 NOTE — Progress Notes (Signed)
SW to evaluate resources available at patient's current facility. Dizziness when UOB better per patient. Patient examined and I agree with the assessment and plan  Violeta Gelinas, MD, MPH, FACS Pager: [phone]  12/28/2011 9:48 AM

## 2011-12-28 NOTE — Discharge Summary (Signed)
Physician Discharge Summary  Patient ID: Suzanne Mcmillan MRN: [id number] DOB/AGE: December 10, 1915 78 y.o.  Admit date: 12/25/2011 Discharge date: 12/28/2011  Discharge Diagnoses Patient Active Problem List   Diagnosis Date Noted  . Fall 12/25/2011  . Concussion 12/25/2011  . Bronchitis 07/02/2011  . Anxiety 06/19/2011  . Depression 02/12/2011  . Insomnia 12/15/2010  . Alzheimer disease 07/06/2006  . HYPERTENSION 07/06/2006    Consultants None  Procedures None  HPI: Suzanne Mcmillan resides at an ALF. She says that she tried to get up by herself, even though she knows she's not supposed to, and fell. She denied LOC. She was evaluated at [facility] ED and had a negative head CT. However, she continued to have symptoms of headache and nausea that the medical staff were not comfortable taking care of. She was transferred to [facility] for further evaluation. Here she denies headache and says her nausea has improved. She was admitted and therapies were ordered.   Hospital Course: The patient made gradual improvements here in the hospital. She progressed with physical and occupational therapies and was good enough to return to her previous level of care with 24-hour supervision and physical therapy. She was discharged there in improved condition.      Medication List     As of 12/28/2011  2:07 PM    TAKE these medications         cetaphil cream   Apply 1 application topically 2 (two) times daily.      loperamide 2 MG capsule   Commonly known as: IMODIUM   Take 2 mg by mouth 4 (four) times daily as needed. For loose stools      risperiDONE 1 MG tablet   Commonly known as: RISPERDAL   Take 1 tablet by mouth at bedtime as needed for insomnia and agitation.      sertraline 25 MG tablet   Commonly known as: ZOLOFT   Take 1 tablet (25 mg total) by mouth daily.      traMADol 50 MG tablet   Commonly known as: ULTRAM   Take 0.5-2 tablets (25-100 mg total) by mouth every 6 (six) hours as needed (25mg   for mild pain, 50mg  for moderate pain, 100mg  for severe pain).      triamcinolone cream 0.5 %   Commonly known as: KENALOG   Apply topically 2 (two) times daily.             Follow-up Information    Call [facility]. (As needed)    Contact information:   [address] [place] [address] [phone]          Signed: Freeman Caldron, PA-C Pager: [phone] General Trauma PA Pager: [phone]  12/28/2011, 2:07 PM

## 2011-12-28 NOTE — Discharge Summary (Signed)
Christene Pounds, MD, MPH, FACS Pager: [phone]  

## 2011-12-30 ENCOUNTER — Telehealth: Payer: Self-pay

## 2011-12-30 NOTE — Telephone Encounter (Signed)
Suzanne Mcmillan PT with [facility] left v/m pt recently fell evaluated at ER. PT orders for home health were received;eval completed and Dr Ermalene Searing could expect paper work. Spoke with Suzanne Mcmillan and advised PCP has been changed to Dr Ronna Polio; Suzanne Mcmillan will contact Dr Tilman Neat office.

## 2012-05-05 ENCOUNTER — Ambulatory Visit: Payer: Medicare Other | Admitting: Adult Health

## 2012-06-28 ENCOUNTER — Ambulatory Visit: Payer: Medicare Other | Admitting: Adult Health

## 2012-06-29 ENCOUNTER — Encounter: Payer: Self-pay | Admitting: Adult Health

## 2012-06-29 ENCOUNTER — Ambulatory Visit (INDEPENDENT_AMBULATORY_CARE_PROVIDER_SITE_OTHER): Payer: Medicare Other | Admitting: Adult Health

## 2012-06-29 VITALS — BP 158/68 | HR 62 | Temp 97.6°F | Resp 14 | Wt 154.0 lb

## 2012-06-29 DIAGNOSIS — IMO0002 Reserved for concepts with insufficient information to code with codable children: Secondary | ICD-10-CM

## 2012-06-29 DIAGNOSIS — L03114 Cellulitis of left upper limb: Secondary | ICD-10-CM | POA: Insufficient documentation

## 2012-06-29 MED ORDER — DOXYCYCLINE HYCLATE 100 MG PO TABS: 100.0000 mg | ORAL_TABLET | Freq: Two times a day (BID) | ORAL | Status: DC

## 2012-06-29 NOTE — Assessment & Plan Note (Signed)
Secondary lesion produced by patient continually irritating and scratching the area. Appears infected. Start doxycycline 100 mg twice a day x10 days. Instructed caregiver that if area redness continues to progress she will need to be seen for IV antibiotics. Area was cleaned and covered with Tegaderm transparent dressing. Provided caregiver with extra Tegaderm to reapply approximately every 3-5 days or if this dressing comes off.

## 2012-06-29 NOTE — Progress Notes (Signed)
  Subjective:    Patient ID: Suzanne Mcmillan, female    DOB: [birth year], 79 y.o.   MRN: [id number]  HPI  Patient is a pleasant 79 year old female with history of dementia who lives at [facility] assisted [facility] who presents to clinic with her caregiver. Patient has an area on the left upper arm with laceration of the skin and surrounding redness. She denies fever or chills. Patient has created secondary lesion from continually "picking"at the area.   Current Outpatient Prescriptions on File Prior to Visit  Medication Sig Dispense Refill  . cetaphil (CETAPHIL) cream Apply 1 application topically 2 (two) times daily.      Marland Kitchen loperamide (IMODIUM) 2 MG capsule Take 2 mg by mouth 4 (four) times daily as needed. For loose stools      . risperiDONE (RISPERDAL) 1 MG tablet Take 1 tablet by mouth at bedtime as needed for insomnia and agitation.  30 tablet  0  . sertraline (ZOLOFT) 25 MG tablet Take 1 tablet (25 mg total) by mouth daily.  30 tablet  3  . triamcinolone (KENALOG) 0.5 % cream Apply topically 2 (two) times daily.  60 g  0   No current facility-administered medications on file prior to visit.     Review of Systems  Constitutional: Negative for fever and chills.  Skin: Positive for wound.       Skin laceration with redness surrounding area of left upper arm    BP 158/68  Pulse 62  Temp(Src) 97.6 F (36.4 C) (Oral)  Resp 14  Wt 154 lb (69.854 kg)  BMI 24.11 kg/m2  SpO2 98%    Objective:   Physical Exam  Constitutional:  Pleasantly demented 79 year old female in no apparent distress.  Cardiovascular: Normal rate and regular rhythm.   Pulmonary/Chest: Effort normal.  Neurological: She is alert.  Skin: Skin is warm. There is erythema.  Approximate 1 inch skin tear secondary lesion. Erythema surrounding area. Patient recently irritated area further and is now slightly bleeding.  Psychiatric: She has a normal mood and affect. Her behavior is normal.       Assessment & Plan:

## 2012-06-29 NOTE — Patient Instructions (Addendum)
  Start Doxycycline 100 mg bid x 10 days for infection in her arm.  If the redness begins to spread and become larger she will need to be seen for IV antibiotics.  Keep the area clean and covered with Tegaderm. This can stay on for 5 days. If it falls off just clean the area gently and reapply the tegaderm.  Once the area is healed you do not need to reapply dressing.  Please call if you have any questions or concerns

## 2012-09-07 ENCOUNTER — Telehealth: Payer: Self-pay | Admitting: Internal Medicine

## 2012-09-07 NOTE — Telephone Encounter (Signed)
Received call this morning from call-a-nurse that the pt needs to schedule an appt for a rash.  Was given # to [facility] and asked to call them to set up the appt for the pt.  Unable to contact a person at [facility], only voicemail.  Unable to schedule an appt at this time.

## 2012-09-07 NOTE — Telephone Encounter (Signed)
FYI to Dr. Walker 

## 2012-09-09 ENCOUNTER — Ambulatory Visit: Payer: Medicare Other | Admitting: Internal Medicine

## 2013-01-04 ENCOUNTER — Telehealth: Payer: Self-pay | Admitting: *Deleted

## 2013-01-04 NOTE — Telephone Encounter (Signed)
Darlene Mapp from the [facility] at [facility] left a message stating she would like an order to obtain an urine specimen on patient to do an UA to check for UTI

## 2013-01-04 NOTE — Telephone Encounter (Signed)
Gave verbal ok to [place] and requested this be faxed to [phone]

## 2013-01-04 NOTE — Telephone Encounter (Signed)
That is fine 

## 2013-01-05 NOTE — Telephone Encounter (Signed)
Printed and faxed yesterday to facility.

## 2013-01-09 ENCOUNTER — Other Ambulatory Visit (INDEPENDENT_AMBULATORY_CARE_PROVIDER_SITE_OTHER): Payer: Medicare Other

## 2013-01-09 DIAGNOSIS — N390 Urinary tract infection, site not specified: Secondary | ICD-10-CM

## 2013-01-09 LAB — POCT URINALYSIS DIPSTICK
Ketones, UA: NEGATIVE
pH, UA: 6

## 2013-01-10 ENCOUNTER — Telehealth: Payer: Self-pay | Admitting: *Deleted

## 2013-01-10 MED ORDER — SULFAMETHOXAZOLE-TMP DS 800-160 MG PO TABS: 1.0000 | ORAL_TABLET | Freq: Two times a day (BID) | ORAL | Status: DC

## 2013-01-10 NOTE — Telephone Encounter (Signed)
Prescription sent to the pharmacy and noted faxed to [facility]

## 2013-01-10 NOTE — Telephone Encounter (Signed)
Message copied by Theola Sequin on Tue Jan 10, 2013  2:55 PM ------      Message from: Ronna Polio A      Created: Mon Jan 09, 2013  4:30 PM       Urine appears to be infected. The last labs I have on this pt are over 77 year old. I would recommend Bactrim SS tab po bid x 7 days. Please confirm no sulfa allergy. Pt should have repeat BMP. ------

## 2013-01-12 ENCOUNTER — Telehealth: Payer: Self-pay | Admitting: *Deleted

## 2013-01-12 MED ORDER — CEPHALEXIN 500 MG PO CAPS: 500.0000 mg | ORAL_CAPSULE | Freq: Three times a day (TID) | ORAL | Status: DC

## 2013-01-12 NOTE — Telephone Encounter (Signed)
Message copied by Theola Sequin on Thu Jan 12, 2013  2:38 PM ------      Message from: Ronna Polio A      Created: Thu Jan 12, 2013  1:22 PM       Urine culture grew bacteria that was resistant to Bactrim. Please make patient aware of this. Please ask pt if she is allergic to cephalosporins such as Keflex. She does have listed allergy to Penicillin. If no allergy to Keflex, I would like to call in Keflex 500mg  po tid x 7 days. ------

## 2013-01-12 NOTE — Telephone Encounter (Signed)
Informed Suzanne Mcmillan of the new orders to switch antibiotics. Prescription faxed to the facility.

## 2013-01-19 ENCOUNTER — Other Ambulatory Visit: Payer: Self-pay | Admitting: *Deleted

## 2013-01-19 MED ORDER — TRIAMCINOLONE ACETONIDE 0.5 % EX CREA: TOPICAL_CREAM | Freq: Two times a day (BID) | CUTANEOUS | Status: DC

## 2013-03-21 ENCOUNTER — Telehealth: Payer: Self-pay | Admitting: Internal Medicine

## 2013-03-21 NOTE — Telephone Encounter (Signed)
Please read below and advise.

## 2013-03-21 NOTE — Telephone Encounter (Signed)
Number left was to a fax machine. Information printed and faxed to facility.

## 2013-03-21 NOTE — Telephone Encounter (Signed)
The patient has a cut on her left arm where she has been picking at a scab . The area is red a swollen . They are asking for an antibiotic cream for the area.

## 2013-03-21 NOTE — Telephone Encounter (Signed)
Needs to be seen

## 2013-05-31 ENCOUNTER — Ambulatory Visit: Payer: Self-pay | Admitting: Internal Medicine

## 2013-06-18 ENCOUNTER — Inpatient Hospital Stay: Payer: Self-pay | Admitting: Internal Medicine

## 2013-06-18 LAB — URINALYSIS, COMPLETE
BILIRUBIN, UR: NEGATIVE
GLUCOSE, UR: NEGATIVE mg/dL (ref 0–75)
KETONE: NEGATIVE
NITRITE: NEGATIVE
PH: 7 (ref 4.5–8.0)
PROTEIN: NEGATIVE
SPECIFIC GRAVITY: 1.013 (ref 1.003–1.030)
Squamous Epithelial: 1
WBC UR: 33 /HPF (ref 0–5)

## 2013-06-18 LAB — CBC WITH DIFFERENTIAL/PLATELET
Basophil #: 0.1 10*3/uL (ref 0.0–0.1)
Basophil %: 0.8 %
Eosinophil #: 0.3 10*3/uL (ref 0.0–0.7)
Eosinophil %: 3.9 %
HCT: 44.5 % (ref 35.0–47.0)
HGB: 14.2 g/dL (ref 12.0–16.0)
LYMPHS PCT: 23.9 %
Lymphocyte #: 1.7 10*3/uL (ref 1.0–3.6)
MCH: 30.4 pg (ref 26.0–34.0)
MCHC: 31.9 g/dL — AB (ref 32.0–36.0)
MCV: 95 fL (ref 80–100)
Monocyte #: 0.6 x10 3/mm (ref 0.2–0.9)
Monocyte %: 8.7 %
NEUTROS PCT: 62.7 %
Neutrophil #: 4.6 10*3/uL (ref 1.4–6.5)
Platelet: 249 10*3/uL (ref 150–440)
RBC: 4.67 10*6/uL (ref 3.80–5.20)
RDW: 14 % (ref 11.5–14.5)
WBC: 7.3 10*3/uL (ref 3.6–11.0)

## 2013-06-18 LAB — BASIC METABOLIC PANEL
Anion Gap: 6 — ABNORMAL LOW (ref 7–16)
BUN: 22 mg/dL — AB (ref 7–18)
CALCIUM: 8.7 mg/dL (ref 8.5–10.1)
CREATININE: 1.03 mg/dL (ref 0.60–1.30)
Chloride: 111 mmol/L — ABNORMAL HIGH (ref 98–107)
Co2: 22 mmol/L (ref 21–32)
GFR CALC AF AMER: 53 — AB
GFR CALC NON AF AMER: 46 — AB
GLUCOSE: 96 mg/dL (ref 65–99)
OSMOLALITY: 281 (ref 275–301)
Potassium: 4.5 mmol/L (ref 3.5–5.1)
SODIUM: 139 mmol/L (ref 136–145)

## 2013-06-18 LAB — TROPONIN I
TROPONIN-I: 0.33 ng/mL — AB
Troponin-I: <0.02 ng/mL

## 2013-06-18 LAB — MAGNESIUM: MAGNESIUM: 1.9 mg/dL

## 2013-06-18 LAB — CK TOTAL AND CKMB (NOT AT ARMC)
CK, TOTAL: 74 U/L
CK, Total: 56 U/L
CK-MB: 1.9 ng/mL (ref 0.5–3.6)
CK-MB: 4.5 ng/mL — AB (ref 0.5–3.6)

## 2013-06-18 LAB — HEPATIC FUNCTION PANEL A (ARMC)
ALT: 24 U/L (ref 12–78)
AST: 29 U/L (ref 15–37)
Albumin: 3.1 g/dL — ABNORMAL LOW (ref 3.4–5.0)
Alkaline Phosphatase: 104 U/L
Bilirubin,Total: 0.5 mg/dL (ref 0.2–1.0)
Total Protein: 7.4 g/dL (ref 6.4–8.2)

## 2013-06-18 LAB — CK-MB
CK-MB: 8.6 ng/mL — AB (ref 0.5–3.6)
CK-MB: 9.2 ng/mL — AB (ref 0.5–3.6)

## 2013-06-19 DIAGNOSIS — I10 Essential (primary) hypertension: Secondary | ICD-10-CM

## 2013-06-19 DIAGNOSIS — F29 Unspecified psychosis not due to a substance or known physiological condition: Secondary | ICD-10-CM

## 2013-06-19 DIAGNOSIS — I214 Non-ST elevation (NSTEMI) myocardial infarction: Secondary | ICD-10-CM

## 2013-06-19 LAB — TROPONIN I
TROPONIN-I: 2.2 ng/mL — AB
Troponin-I: 2 ng/mL — ABNORMAL HIGH

## 2013-06-20 ENCOUNTER — Encounter: Payer: Self-pay | Admitting: Cardiovascular Disease

## 2013-06-20 DIAGNOSIS — I517 Cardiomegaly: Secondary | ICD-10-CM

## 2013-06-20 DIAGNOSIS — I498 Other specified cardiac arrhythmias: Secondary | ICD-10-CM

## 2013-06-20 LAB — LIPID PANEL
Cholesterol: 136 mg/dL (ref 0–200)
HDL: 51 mg/dL (ref 40–60)
LDL CHOLESTEROL, CALC: 72 mg/dL (ref 0–100)
Triglycerides: 66 mg/dL (ref 0–200)
VLDL CHOLESTEROL, CALC: 13 mg/dL (ref 5–40)

## 2013-06-21 ENCOUNTER — Encounter: Payer: Self-pay | Admitting: Cardiovascular Disease

## 2013-06-28 ENCOUNTER — Ambulatory Visit (INDEPENDENT_AMBULATORY_CARE_PROVIDER_SITE_OTHER): Payer: Medicare Other | Admitting: Cardiology

## 2013-06-28 ENCOUNTER — Encounter: Payer: Self-pay | Admitting: Cardiology

## 2013-06-28 VITALS — BP 120/52 | HR 53 | Ht 67.0 in | Wt 152.5 lb

## 2013-06-28 DIAGNOSIS — F0280 Dementia in other diseases classified elsewhere without behavioral disturbance: Secondary | ICD-10-CM

## 2013-06-28 DIAGNOSIS — G309 Alzheimer's disease, unspecified: Secondary | ICD-10-CM

## 2013-06-28 DIAGNOSIS — I214 Non-ST elevation (NSTEMI) myocardial infarction: Secondary | ICD-10-CM

## 2013-06-28 DIAGNOSIS — I10 Essential (primary) hypertension: Secondary | ICD-10-CM

## 2013-06-28 MED ORDER — METOPROLOL TARTRATE 25 MG PO TABS: 12.5000 mg | ORAL_TABLET | Freq: Two times a day (BID) | ORAL | Status: AC

## 2013-06-28 MED ORDER — ASPIRIN EC 81 MG PO TBEC: 81.0000 mg | DELAYED_RELEASE_TABLET | Freq: Every day | ORAL | Status: DC

## 2013-06-28 NOTE — Assessment & Plan Note (Signed)
Pt recently hospitalized 06/19/13 after a syncopal spell- Troponin positive and apical HK on echo- medical Rx

## 2013-06-28 NOTE — Progress Notes (Signed)
06/28/2013 Suzanne ButcherLyda H Mcmillan   9/6/04542/05/1915  [id number]  Primary Physicia Wynona DoveWALKER,JENNIFER AZBELL, MD Primary Cardiologist: Dr Kirke CorinArida  HPI:  Pleasantly confused 80 y/o female who is resident of an assisted living facility. She [facility] with [facility]. Her EKG was abnormal with TWI and her Troponin came back elevated and peaked at 2.20. She was seen in consult by Dr Kirke CorinArida who recommended medical Rx only. She was put on anticoagulation for 24 hrs and Norvasc and Metoprolol were added. She was hypertensive on admission and this was improved by discharge. Echo showed an EF of 45-50% with apical HK. She is in the office today for follow up. She is here with her daughter. The pt is cooperative but confused. She didn't know she was recently hospitalized. According to the pt's daughter she has been doing well. She never had chest pain. She has had no further falls or syncope.    Current Outpatient Prescriptions  Medication Sig Dispense Refill  . amLODipine (NORVASC) 5 MG tablet Take 5 mg by mouth daily.      . cetaphil (CETAPHIL) cream Apply 1 application topically 2 (two) times daily.      Marland Kitchen. loperamide (IMODIUM) 2 MG capsule Take 2 mg by mouth 4 (four) times daily as needed. For loose stools      . metoprolol tartrate (LOPRESSOR) 25 MG tablet Take 0.5 tablets (12.5 mg total) by mouth 2 (two) times daily.      Marland Kitchen. nystatin cream (MYCOSTATIN) Apply 1 application topically 2 (two) times daily.      . risperiDONE (RISPERDAL) 1 MG tablet Take 1 tablet by mouth at bedtime as needed for insomnia and agitation.  30 tablet  0  . sertraline (ZOLOFT) 25 MG tablet Take 1 tablet (25 mg total) by mouth daily.  30 tablet  3  . triamcinolone cream (KENALOG) 0.5 % Apply topically 2 (two) times daily.  60 g  2   No current facility-administered medications for this visit.    Allergies  Allergen Reactions  . Penicillins     REACTION: Rash, redness    History   Social History  . Marital Status: Widowed     Spouse Name: N/A    Number of Children: N/A  . Years of Education: N/A   Occupational History  . 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 Smoking status: Never Smoker   . Smokeless tobacco: Not on file  . Alcohol Use: No  . Drug Use: No  . Sexual Activity: No   Other Topics Concern  . Not on file   Social History Narrative   Lives in [place] [facility] [facility]. Has daughter.      Hobbies : walking and reading   Exercise- walking      Review of Systems: (per pt's daughter) General: negative for chills, fever, night sweats or weight changes.  Cardiovascular: negative for chest pain, dyspnea on exertion, edema, orthopnea, palpitations, paroxysmal nocturnal dyspnea or shortness of breath Dermatological: negative for rash Respiratory: negative for cough or wheezing Urologic: negative for hematuria Abdominal: negative for nausea, vomiting, diarrhea, bright red blood per rectum, melena, or hematemesis Neurologic: negative for visual changes, syncope, or dizziness All other systems reviewed and are otherwise negative except as noted above.    Blood pressure 120/52, pulse 53, height 5\' 7"  (1.702 m), weight 152 lb 8 oz (69.174 kg).  General appearance: alert, cooperative and no distress Lungs: clear to auscultation bilaterally Heart: regular  rate and rhythm and soft systolic murmur AOv, S4 Extremities: no edema  EKG NSR, SB, TWI 1, AVL, V2, poor ant RW, LAFB, LVH  ASSESSMENT AND PLAN:   NSTEMI (non-ST elevated myocardial infarction) Pt recently hospitalized 06/19/13 after a syncopal spell- Troponin positive and apical HK on echo- medical Rx   HYPERTENSION Controlled  Alzheimer disease Pleasantly confused, assisted living pt, she is DNR   PLAN  Medical Rx only. She is NCB. I added ASA 81 mg and decreased her Metoprolol to 12.5 mg BID. She can see us on a PRN basis.   Eda PaschalLuke K [facility] LLCKilroyPA-C 06/28/2013 2:50 PM

## 2013-06-28 NOTE — Assessment & Plan Note (Signed)
Pleasantly confused, assisted living pt, she is DNR

## 2013-06-28 NOTE — Patient Instructions (Signed)
Start Aspirin 81 mg daily Decrease Metoprolol to 12.5 mg Twice a day

## 2013-06-28 NOTE — Assessment & Plan Note (Signed)
Controlled.  

## 2013-06-30 ENCOUNTER — Ambulatory Visit: Payer: Self-pay | Admitting: Internal Medicine

## 2013-12-03 ENCOUNTER — Emergency Department: Payer: Self-pay | Admitting: Emergency Medicine

## 2013-12-03 LAB — CBC
HCT: 39.1 % (ref 35.0–47.0)
HGB: 13 g/dL (ref 12.0–16.0)
MCH: 31.6 pg (ref 26.0–34.0)
MCHC: 33.2 g/dL (ref 32.0–36.0)
MCV: 95 fL (ref 80–100)
Platelet: 210 10*3/uL (ref 150–440)
RBC: 4.1 10*6/uL (ref 3.80–5.20)
RDW: 13.9 % (ref 11.5–14.5)
WBC: 8.3 10*3/uL (ref 3.6–11.0)

## 2013-12-03 LAB — URINALYSIS, COMPLETE
BACTERIA: NONE SEEN
BILIRUBIN, UR: NEGATIVE
BLOOD: NEGATIVE
GLUCOSE, UR: NEGATIVE mg/dL (ref 0–75)
KETONE: NEGATIVE
Leukocyte Esterase: NEGATIVE
Nitrite: NEGATIVE
Ph: 6 (ref 4.5–8.0)
Protein: NEGATIVE
RBC,UR: 1 /HPF (ref 0–5)
SPECIFIC GRAVITY: 1.012 (ref 1.003–1.030)
WBC UR: 1 /HPF (ref 0–5)

## 2013-12-03 LAB — BASIC METABOLIC PANEL
Anion Gap: 7 (ref 7–16)
BUN: 14 mg/dL (ref 7–18)
Calcium, Total: 8.4 mg/dL — ABNORMAL LOW (ref 8.5–10.1)
Chloride: 109 mmol/L — ABNORMAL HIGH (ref 98–107)
Co2: 25 mmol/L (ref 21–32)
Creatinine: 1.08 mg/dL (ref 0.60–1.30)
GFR CALC NON AF AMER: 50 — AB
Glucose: 116 mg/dL — ABNORMAL HIGH (ref 65–99)
OSMOLALITY: 283 (ref 275–301)
Potassium: 4 mmol/L (ref 3.5–5.1)
SODIUM: 141 mmol/L (ref 136–145)

## 2013-12-03 LAB — TROPONIN I: Troponin-I: 0.03 ng/mL

## 2013-12-08 ENCOUNTER — Ambulatory Visit: Payer: Medicare Other | Admitting: Internal Medicine

## 2013-12-08 DIAGNOSIS — Z0289 Encounter for other administrative examinations: Secondary | ICD-10-CM

## 2014-05-29 ENCOUNTER — Telehealth: Payer: Self-pay | Admitting: Internal Medicine

## 2014-05-29 NOTE — Telephone Encounter (Signed)
I have not seen her since 2013. She must be seen so that [facility] can [facility]. If she is under the care of another physician, then they would be able to fill this out.

## 2014-05-29 NOTE — Telephone Encounter (Signed)
Daughter would like for the doctor to give her a call back to discuss why her mother has to come in [facility].

## 2014-05-30 NOTE — Telephone Encounter (Signed)
The patient's daughter called and is hoping to find out when she can be worked in for a visit to fill out this FL2 form.  She is hoping to have her elderly mother transferred to a nursing home asap.   When do you want this pt worked in?   pts daughter callback - [phone]

## 2014-05-30 NOTE — Telephone Encounter (Signed)
Suzanne Mcmillan was working on this. We discussed 3:30pm on Monday

## 2014-05-30 NOTE — Telephone Encounter (Signed)
The patient's daughter has been notified to bring the patient in on 4.4.16 @ 3:30.

## 2014-06-04 ENCOUNTER — Encounter: Payer: Self-pay | Admitting: Internal Medicine

## 2014-06-04 ENCOUNTER — Ambulatory Visit (INDEPENDENT_AMBULATORY_CARE_PROVIDER_SITE_OTHER): Payer: PPO | Admitting: Internal Medicine

## 2014-06-04 VITALS — BP 152/78 | HR 58 | Temp 97.5°F | Ht 65.75 in | Wt 148.1 lb

## 2014-06-04 DIAGNOSIS — Z23 Encounter for immunization: Secondary | ICD-10-CM

## 2014-06-04 DIAGNOSIS — F0280 Dementia in other diseases classified elsewhere without behavioral disturbance: Secondary | ICD-10-CM

## 2014-06-04 DIAGNOSIS — F419 Anxiety disorder, unspecified: Secondary | ICD-10-CM

## 2014-06-04 DIAGNOSIS — I10 Essential (primary) hypertension: Secondary | ICD-10-CM | POA: Diagnosis not present

## 2014-06-04 DIAGNOSIS — I2510 Atherosclerotic heart disease of native coronary artery without angina pectoris: Secondary | ICD-10-CM | POA: Insufficient documentation

## 2014-06-04 DIAGNOSIS — G309 Alzheimer's disease, unspecified: Secondary | ICD-10-CM | POA: Diagnosis not present

## 2014-06-04 NOTE — Progress Notes (Signed)
Subjective:    Patient ID: Suzanne Mcmillan, female    DOB: [birth year], 81 y.o.   MRN: [id number]  HPI  81YO female presents for follow up. Last seen by me in 07/2011, in our office for acute issues in 05/2012.  Hospitalized last April 2015 for NSTEMI. She denies any recent chest pain, dyspnea.  Also evaluated in October for acute confusion. Testing was reportedly normal. Unclear what led to this incident. No recurrent episodes of acute delirium.  Daughter reports she has been doing well. Stable over last several months. She is unable to answer questions other than to deny any concerns.  Assisted living facility reports compliance with medications.  Past medical, surgical, family and social history per today's encounter.  Review of Systems  Unable to perform ROS      Objective:    BP 152/78 mmHg  Pulse 58  Temp(Src) 97.5 F (36.4 C) (Oral)  Ht 5' 5.75" (1.67 m)  Wt 148 lb 2 oz (67.189 kg)  BMI 24.09 kg/m2  SpO2 100% Physical Exam  Constitutional: She is oriented to person, place, and time. She appears well-developed and well-nourished. No distress.  HENT:  Head: Normocephalic and atraumatic.  Right Ear: External ear normal.  Left Ear: External ear normal.  Nose: Nose normal.  Mouth/Throat: Oropharynx is clear and moist. No oropharyngeal exudate.  Eyes: Conjunctivae are normal. Pupils are equal, round, and reactive to light. Right eye exhibits no discharge. Left eye exhibits no discharge. No scleral icterus.  Neck: Normal range of motion. Neck supple. No tracheal deviation present. No thyromegaly present.  Cardiovascular: Normal rate, regular rhythm, normal heart sounds and intact distal pulses.  Exam reveals no gallop and no friction rub.   No murmur heard. Pulmonary/Chest: Effort normal and breath sounds normal. No respiratory distress. She has no wheezes. She has no rales. She exhibits no tenderness.  Musculoskeletal: Normal range of motion. She exhibits no edema or  tenderness.  Lymphadenopathy:    She has no cervical adenopathy.  Neurological: She is alert and oriented to person, place, and time. No cranial nerve deficit. She exhibits normal muscle tone. Coordination normal.  Skin: Skin is warm and dry. No rash noted. She is not diaphoretic. No erythema. No pallor.  Psychiatric: She has a normal mood and affect. Her speech is normal and behavior is normal. Thought content normal. Cognition and memory are impaired. She expresses impulsivity and inappropriate judgment. She exhibits abnormal recent memory and abnormal remote memory.          Assessment & Plan:   Problem List Items Addressed This Visit      Unprioritized   Alzheimer disease    Continues to be confused, however appears to be well-adjusted to current living situation. No concerns today from her daughter, who is primary HCPOA. DNR      Anxiety    Symptoms well controlled with Sertraline and Risperidone. Will continue.      CAD (coronary artery disease)    S/p recent NSTEMI. Denies any current symptoms of chest pain, dyspnea. Continue supportive care. Avoiding statin because of age. Continue aspirin.      Hypertension - Primary    BP Readings from Last 3 Encounters:  06/04/14 152/78  06/28/13 120/52  06/29/12 158/68   BP slightly elevated today, but has generally been well controlled. Will continue current medications. Renal function with labs today. Continue to monitor BP at nursing facility.      Relevant Orders   CBC with Differential/Platelet  Comprehensive metabolic panel    Other Visit Diagnoses    Need for vaccination with 13-polyvalent pneumococcal conjugate vaccine        Relevant Orders    Pneumococcal conjugate vaccine 13-valent (Completed)        Return in about 1 year (around 06/04/2015) for Recheck.

## 2014-06-04 NOTE — Assessment & Plan Note (Signed)
Symptoms well controlled with Sertraline and Risperidone. Will continue.

## 2014-06-04 NOTE — Patient Instructions (Signed)
Labs today

## 2014-06-04 NOTE — Assessment & Plan Note (Signed)
S/p recent NSTEMI. Denies any current symptoms of chest pain, dyspnea. Continue supportive care. Avoiding statin because of age. Continue aspirin.

## 2014-06-04 NOTE — Progress Notes (Signed)
Pre visit review using our clinic review tool, if applicable. No additional management support is needed unless otherwise documented below in the visit note. 

## 2014-06-04 NOTE — Assessment & Plan Note (Signed)
Continues to be confused, however appears to be well-adjusted to current living situation. No concerns today from her daughter, who is primary HCPOA. DNR

## 2014-06-04 NOTE — Assessment & Plan Note (Signed)
BP Readings from Last 3 Encounters:  06/04/14 152/78  06/28/13 120/52  06/29/12 158/68   BP slightly elevated today, but has generally been well controlled. Will continue current medications. Renal function with labs today. Continue to monitor BP at nursing facility.

## 2014-06-05 ENCOUNTER — Telehealth: Payer: Self-pay | Admitting: Internal Medicine

## 2014-06-05 ENCOUNTER — Encounter: Payer: Self-pay | Admitting: *Deleted

## 2014-06-05 LAB — CBC WITH DIFFERENTIAL/PLATELET
BASOS ABS: 0.1 10*3/uL (ref 0.0–0.1)
Basophils Relative: 1.6 % (ref 0.0–3.0)
Eosinophils Absolute: 0.4 10*3/uL (ref 0.0–0.7)
Eosinophils Relative: 7.7 % — ABNORMAL HIGH (ref 0.0–5.0)
HCT: 39 % (ref 36.0–46.0)
HEMOGLOBIN: 12.9 g/dL (ref 12.0–15.0)
LYMPHS ABS: 1.4 10*3/uL (ref 0.7–4.0)
Lymphocytes Relative: 24.2 % (ref 12.0–46.0)
MCHC: 33.2 g/dL (ref 30.0–36.0)
MCV: 93.2 fl (ref 78.0–100.0)
MONOS PCT: 10.2 % (ref 3.0–12.0)
Monocytes Absolute: 0.6 10*3/uL (ref 0.1–1.0)
NEUTROS PCT: 56.3 % (ref 43.0–77.0)
Neutro Abs: 3.2 10*3/uL (ref 1.4–7.7)
PLATELETS: 230 10*3/uL (ref 150.0–400.0)
RBC: 4.18 Mil/uL (ref 3.87–5.11)
RDW: 15.2 % (ref 11.5–15.5)
WBC: 5.7 10*3/uL (ref 4.0–10.5)

## 2014-06-05 LAB — COMPREHENSIVE METABOLIC PANEL
ALBUMIN: 3.5 g/dL (ref 3.5–5.2)
ALK PHOS: 94 U/L (ref 39–117)
ALT: 13 U/L (ref 0–35)
AST: 23 U/L (ref 0–37)
BILIRUBIN TOTAL: 0.4 mg/dL (ref 0.2–1.2)
BUN: 23 mg/dL (ref 6–23)
CALCIUM: 9.3 mg/dL (ref 8.4–10.5)
CO2: 24 mEq/L (ref 19–32)
CREATININE: 1 mg/dL (ref 0.40–1.20)
Chloride: 111 mEq/L (ref 96–112)
GFR: 54.51 mL/min — ABNORMAL LOW (ref >60.00)
Glucose, Bld: 93 mg/dL (ref 70–99)
Potassium: 4.4 mEq/L (ref 3.5–5.1)
Sodium: 142 mEq/L (ref 135–145)
Total Protein: 7 g/dL (ref 6.0–8.3)

## 2014-06-05 NOTE — Telephone Encounter (Signed)
emmi mailed  °

## 2014-06-23 NOTE — Consult Note (Signed)
Brief Consult Note: Diagnosis: NSTEMI , dementia?.   Patient was seen by consultant.   Consult note dictated.   Comments: Not able to obtain history from the patient. she is confused.  She was hypertensive on presentation. MI could be supply demand vs. true ischemic event. Regardless, I recommend medical therapy give age and functional status.  Continue Aspirin and Metoprolol.  I switched Hydralazine to Amlodipine for ease of adminstation.  Electronic Signatures: Lorine BearsArida, Muhammad (MD)  (Signed 20-Apr-15 09:31)  Authored: Brief Consult Note   Last Updated: 20-Apr-15 09:31 by Lorine BearsArida, Muhammad (MD)

## 2014-06-23 NOTE — Discharge Summary (Signed)
PATIENT NAME:  Suzanne Mcmillan, Suzanne Mcmillan MR#:  [id number] DATE OF BIRTH:  16-Jul-1915  DATE OF ADMISSION:  06/18/2013 DATE OF DISCHARGE:  06/21/2013  DISCHARGE DIAGNOSES:  1.  Dizziness secondary to malignant hypertension.  2.  Non-ST-elevation myocardial infarction. 3.  Dementia.   DISCHARGE MEDICATIONS:  1. Zoloft 25 mg daily. 2.  Risperdal 1 mg as needed.  3.  Metoprolol 25 mg p.o. b.i.d.  4.  Amlodipine 5 mg p.o. daily.   CODE STATUS: DNR.   CONSULTATIONS: Cardiology consult with Dr. Dossie Arbourim Gollan and Dr. Kirke CorinArida.  HOSPITAL COURSE: A 81 year old female patient with history of dementia, who came from [facility] because of near syncope and also dizziness. The patient had an episode of dizziness, vomiting and near syncope on the 19th and found to have very elevated blood pressure of systolic 200s/190s. The patient did not have any chest pain and the labs were normal on admission. EKG shows some T wave inversions in lateral leads, which are new. She was admitted to hospitalist service. The patient's malignant hypertension was treated with beta blockers and hydralazine. The patient's initial troponins were negative, but the second troponin was 0.33 and third one was 2.20. The patient was seen by cardiology. We gave her a dose of Lovenox 70 mg subcutaneous one dose and cardiology felt that it was likely secondary to malignant hypertension. The patient did not have chest pain. Because of her advanced age, medical management was recommended. The patient's echocardiogram showed EF of 45% to 50% with slightly decreased LV function and there is severe hypokinesia of the apical region. The patient was also seen by palliative care team and the patient's CODE STATUS is no code. She is doing good. She does have sometimes agitation and right now she is on Risperdal and Zoloft. The patient's troponin trended down to 2. Cardiology did not recommend further anticoagulation. The patient's lipid panel showed LDL of 72 and HDL of  51, so we are not giving her any statins. The patient's blood pressure this morning is 117/69, pulse 73, sats 97% on room air and temperature 97.5.  PHYSICAL EXAMINATION:  CARDIOVASCULAR: S1, S2 regular.  LUNGS: Clear to auscultation. No wheeze. No rales. ABDOMEN: Soft, nontender, nondistended. Bowel sounds present.  The patient is stable to go back to [facility] [place].  The patient did have some episodes of SVT, now complex tachycardia, but heart rate is like in the 50s most of the time, so the patient right now is on metoprolol only.  TIME SPENT ON DISCHARGE PREPARATION: More than 30 minutes.  ____________________________ Katha HammingSnehalatha Supriya Beaston, MD sk:aw D: 06/21/2013 08:35:57 ET T: 06/21/2013 08:55:07 ET JOB#: [id number]  cc: Katha HammingSnehalatha Katharine Rochefort, MD, <Dictator> Katha HammingSNEHALATHA Zavien Clubb MD ELECTRONICALLY SIGNED 07/04/2013 13:30

## 2014-06-23 NOTE — H&P (Signed)
PATIENT NAME:  Suzanne Mcmillan, Suzanne Mcmillan MR#:  [id number] DATE OF BIRTH:  1915/12/19  DATE OF ADMISSION:  06/18/2013   EMERGENCY ROOM PHYSICIAN: Dr. Margarita Grizzle.   CHIEF COMPLAINT: Dizziness.   HISTORY OF PRESENT ILLNESS: A 81 year old female patient coming from [facility]. The patient had dizziness and vomiting and near syncopal episode this morning around 5:30 and she was brought in by the ambulance. The patient has hit her head on the right side of her forehead. According to the daughter the patient did not lose consciousness. The patient is awake, alert, oriented. The patient denies any chest pain. No trouble breathing. The patient just for dizzy this morning and had episode of vomiting. The patient denies any weakness in hands or legs. The patient is living at [facility] for about two and a half years. She is independent of activities. Walks with a walker. Eats regular food.   PAST MEDICAL HISTORY: Significant for depression.   ALLERGIES: PENICILLIN.   SOCIAL HISTORY: No smoking. No drinking. Uses walker for walking.   PAST SURGICAL HISTORY: None.   MEDICATIONS:   1. Imodium as needed.  2. Risperdal 1 mg 1 tablet at bedtime as needed.  3. Zoloft 25 mg daily.  4. Triamcinolone topical lotion to the arms and legs 2 times daily as needed.   But only medication that she takes a regular basis is Zoloft 25 mg daily.   FAMILY HISTORY: No hypertension or diabetes.   REVIEW OF SYSTEMS:  CONSTITUTIONAL: No fever. No fatigue.  EYES: No blurred vision.  ENT: Hard of hearing but no epistaxis. No difficulty swallowing.  HEAD: The patient had a  small bump on her forehead on the right side but no headache. No blurred vision.  CARDIOVASCULAR: No chest pain. No trouble breathing.  PULMONARY: Denies any trouble breathing. The patient has no history of asthma.  GASTROINTESTINAL: No nausea. The patient had one episode of vomiting this morning, but no diarrhea. No constipation.   NEUROLOGIC: No history of strokes or TIAs.  PSYCHIATRIC: No anxiety. The patient has history of  depression.   PHYSICAL EXAMINATION: VITAL SIGNS: Temperature 97.5, heart rate is around 65 during my visit, blood pressure initially 200/85 stayed around that during my visit, O2 sats 98% on room air.  GENERAL: Alert, awake, oriented, elderly female not in distress, answers questions appropriately.  HEAD: Normocephalic had a small had abrasion on her right forehead.  EYES: Pupils equally reacting to light. Extraocular movements intact.  EARS, NOSE AND THROAT;No tympanic membrane congestion, No turbinate hypertrophy. (no oropharyngel erythema NECK: Supple, no JVD, no carotid bruit.  CARDIOVASCULAR: S1, S2 regular. No murmurs. The patient's PMI not displaced. Pulse equal  in caroid and dorsalis pedis LUNGS: Clear to auscultation. No wheeze noted. Not using accessory muscles of respiration. Marland Kitchen  GASTROINTESTINAL: Abdomen is soft, nontender, nondistended. Bowel sounds present. No organomegaly. No hernias are given.  GENITOURINARY: Deferred.  MUSCULOSKELETAL: Able to move extremities x 4. No cyanosis,  no clubbing.  NEUROLOGIC: Alert, awake, oriented. Cranial nerves II through XII intact both upper and lower extremities. Sensation is intact. Deep tendon reflexes 2+ bilaterally.  PSYCHIATRIC: Mood and affect are within normal limits.  SKIN: Warm and dry. No skin rashes.   LABORATORY AND DIAGNOSTICS: Head CT shows atrophy and small vessel changes. No acute infarct.   CT cervical spine shows the patient has no obvious cervical spine fracture or traumatic subluxation. No paravertebral soft tissues, moderate disk space narrowing. Carotid artery sclerosis,  no worrisome lesions.  Chest x-ray: Clear no infiltrates.   WBC 7.3, hemoglobin 14.2, hematocrit 34.5, platelets 249.   ELECTROLYTES: Sodium is 139, potassium4.9 , chloride 111, bicarbonate 22, BUN 22, creatinine 1, glucose 96. The patient's troponin  less than 0.02 and the patient's LFTs are within normal limits. Albumin 3.1, magnesium 1.9. EKG shows normal sinus rhythm, 60 beats per minute, T-wave inversion in V4, V5, V6, which are are new compared to EKG two years ago.   ASSESSMENT AND PLAN: 1. The patient is a 81 year old female patient with dizziness and near syncope likely secondary to malignant hypertension. The patient is admitted to telemetry. The patient's symptoms are probably due to blood pressure so we are going to start her on blood pressure medications, namely hydralazine, beta blockers and see how she does.  2. Possible (ACS:> with EKG changes, and also check the troponins and CK x 3. Continue her beta blockers and small dose aspirin.  3. Dizziness with possible carotid disease. Get carotid ultrasound.  4. Depression continue home medication.  5. Gastrointestinal prophylaxis and deep vein thrombosis prophylaxis.  6. CODE STATUS: Full code.   Discussed this plan with the patient's daughter.   For nausea use Zofran.   TIME SPENT:   More than 60 minutes.    ____________________________ Katha HammingSnehalatha Parneet Glantz, MD sk:sg D: 06/18/2013 08:41:00 ET T: 06/18/2013 08:57:08 ET JOB#: [id number]  cc: Katha HammingSnehalatha Contrell Ballentine, MD, <Dictator> Katha HammingSNEHALATHA Ji Feldner MD ELECTRONICALLY SIGNED 07/04/2013 13:25

## 2014-06-23 NOTE — Consult Note (Signed)
PATIENT NAME:  Suzanne HaroldDRIVER, Dashanna H MR#:  [id number] DATE OF BIRTH:  09-18-1915  DATE OF CONSULTATION:  06/19/2013  REFERRING PHYSICIAN:  Dr. Luberta MutterKonidena  CONSULTING PHYSICIAN:  Jerolyn CenterMuhammad A. Kirke CorinArida, MD  REASON FOR CONSULTATION: Myocardial infarction.   HISTORY OF PRESENT ILLNESS: The patient is a [age] year old female, who was hospitalized yesterday from [facility]. She was brought due to dizziness, vomiting, and near syncope. She denied any chest pain or shortness of breath. The patient is currently confused and not able to provide me with any history. She was noted to be hypertensive on presentation with blood pressure of 200/85. She is not known to have a history of hypertension. Initial troponin was negative. Subsequently, the second troponin went up to 0.33 and most recent one was 2.20 with a CK-MB of 9.2. EKG showed sinus rhythm with left ventricular hypertrophy and lateral T wave changes.   PAST MEDICAL HISTORY: Includes depression.   HOME MEDICATIONS: Include Imodium as needed, Risperdal at bedtime as needed, Zoloft and triamcinolone topical.   ALLERGIES: PENICILLIN.   SOCIAL HISTORY: Negative for smoking, alcohol or recreational drug use. She lives in an assisted living facility. She walks with a walker.   FAMILY HISTORY: Negative for coronary artery disease.   PAST SURGICAL HISTORY: None.   REVIEW OF SYSTEMS: Unable to obtain at this time due to confusion.   PHYSICAL EXAMINATION:  GENERAL: The patient appears to be younger than her stated age. She is currently in no acute distress.  VITAL SIGNS: Blood pressure on presentation was 200/85, but currently is 129/57, heart rate is 66 and oxygen saturation is 95% on room air. The patient was not cooperative for physical exam.   NECK: Overall, no JVD or carotid bruits.  RESPIRATORY: Normal respiratory effort with no use of accessory muscles. Auscultation revealed normal breath sounds.  CARDIOVASCULAR: Normal PMI. Normal S1  and S2 with no gallops or murmurs.  ABDOMEN: Benign, nontender, and nondistended.  EXTREMITIES: No clubbing, cyanosis, or edema.  SKIN: Warm and dry with no rash.  PSYCHIATRIC: She is confused and not oriented.   LABORATORY AND DIAGNOSTIC DATA: Creatinine was 1.01. CBC is normal. Troponin as mentioned above. EKG showed sinus rhythm with borderline left ventricular hypertrophy and left anterior fascicular block. There is T wave changes in the lateral leads suggestive of ischemia. Chest x-ray showed no significant abnormality. CT scan of the head showed small vessel disease.   IMPRESSION:  1.  Non-ST elevation myocardial infarction.  2.  Newly diagnosed hypertension.  3.  Confusion.   RECOMMENDATIONS: The patient presented with atypical symptoms of dizziness and vomiting with elevated blood pressure. Subsequent troponin has been positive indicating myocardial injury. It is difficult to determine whether this is due to supply demand ischemia or a true ischemic event. I am not able to obtain history from the patient at the present time due to confusion. Obviously, at her age, it is possible to have an atypical presentation. However, regardless given her age and current mental status, I recommend medical therapy with aspirin, a beta blocker and blood pressure control. Short term anticoagulation for 24 to 48 hours is reasonable. An echocardiogram was requested. I switched hydralazine to amlodipine given that it is once a day medication. No further ischemic evaluation is recommended at the present time.  ____________________________ Chelsea AusMuhammad A. Kirke CorinArida, MD maa:aw D: 06/19/2013 09:38:27 ET T: 06/19/2013 09:52:29 ET JOB#: [id number]  cc: Jerolyn CenterMuhammad A. Kirke CorinArida, MD, <Dictator> Iran OuchMUHAMMAD A ARIDA MD ELECTRONICALLY SIGNED 06/22/2013 8:41

## 2014-09-21 ENCOUNTER — Encounter: Payer: Self-pay | Admitting: *Deleted

## 2014-09-21 ENCOUNTER — Telehealth: Payer: Self-pay | Admitting: *Deleted

## 2014-09-21 NOTE — Telephone Encounter (Signed)
Letter faxed.

## 2014-09-21 NOTE — Telephone Encounter (Signed)
Suzanne Mcmillan called requesting an order for an in and out cath and a UA due to a foul urine odor and urine discoloration.  Please advise

## 2014-09-21 NOTE — Telephone Encounter (Signed)
That is fine 

## 2014-09-27 ENCOUNTER — Encounter: Payer: Self-pay | Admitting: *Deleted

## 2014-09-27 ENCOUNTER — Telehealth: Payer: Self-pay | Admitting: *Deleted

## 2014-09-27 DIAGNOSIS — W19XXXD Unspecified fall, subsequent encounter: Secondary | ICD-10-CM

## 2014-09-27 NOTE — Telephone Encounter (Signed)
Fine to fax Rx. 

## 2014-09-27 NOTE — Telephone Encounter (Signed)
pts daughter came in requesting order for wheelchair be faxed to [facility] at [phone].  Please advise

## 2014-09-27 NOTE — Telephone Encounter (Signed)
DME written and faxed as requested.

## 2014-10-17 ENCOUNTER — Telehealth: Payer: Self-pay | Admitting: Internal Medicine

## 2014-10-17 ENCOUNTER — Other Ambulatory Visit: Payer: Self-pay | Admitting: *Deleted

## 2014-10-17 MED ORDER — DOXYCYCLINE HYCLATE 100 MG PO CAPS: 100.0000 mg | ORAL_CAPSULE | Freq: Two times a day (BID) | ORAL | Status: DC

## 2014-10-17 NOTE — Telephone Encounter (Signed)
Received urine culture showing E.Coli which was resistant to most antibiotics. We can try Doxycycline  twice daily, however if symptoms persistent, then needs to be seen and will likely need IV antibiotics.

## 2014-10-17 NOTE — Telephone Encounter (Signed)
Notified pt's home of results and Rx

## 2014-10-25 ENCOUNTER — Encounter: Payer: Self-pay | Admitting: Internal Medicine

## 2014-10-31 ENCOUNTER — Telehealth: Payer: Self-pay | Admitting: Internal Medicine

## 2014-10-31 NOTE — Telephone Encounter (Signed)
Pt daughter called about pt having a UTI. Pt wants to see DR Dan Humphreys. Pt needs a late appt. Daughter states it within the next week at around 10am. It's only am appts avail. Thank You!

## 2014-10-31 NOTE — Telephone Encounter (Signed)
Done. Thank you.

## 2014-10-31 NOTE — Telephone Encounter (Signed)
Per Dr Dan Humphreys 4:00 tomorrow is okay

## 2014-11-01 ENCOUNTER — Encounter: Payer: Self-pay | Admitting: Internal Medicine

## 2014-11-01 ENCOUNTER — Ambulatory Visit (INDEPENDENT_AMBULATORY_CARE_PROVIDER_SITE_OTHER): Payer: PPO | Admitting: Internal Medicine

## 2014-11-01 VITALS — BP 128/68 | HR 76 | Ht 65.75 in

## 2014-11-01 DIAGNOSIS — I2510 Atherosclerotic heart disease of native coronary artery without angina pectoris: Secondary | ICD-10-CM | POA: Diagnosis not present

## 2014-11-01 DIAGNOSIS — N3000 Acute cystitis without hematuria: Secondary | ICD-10-CM

## 2014-11-01 DIAGNOSIS — N390 Urinary tract infection, site not specified: Secondary | ICD-10-CM | POA: Insufficient documentation

## 2014-11-01 MED ORDER — DOXYCYCLINE HYCLATE 100 MG PO CAPS: 100.0000 mg | ORAL_CAPSULE | Freq: Two times a day (BID) | ORAL | Status: DC

## 2014-11-01 NOTE — Assessment & Plan Note (Signed)
Recent UTI, supposed to be treated with Doxycycline, however daughter and pt unclear if this has been treated. Will send message to home health to clarify. Pt unable to perform UA here in office today. Will try to obtain follow up sample at home.

## 2014-11-01 NOTE — Patient Instructions (Signed)
Please start Doxycycline  twice daily to treat urinary tract infection.  Please repeat urine culture in the next 2 weeks.

## 2014-11-01 NOTE — Progress Notes (Signed)
Pre visit review using our clinic review tool, if applicable. No additional management support is needed unless otherwise documented below in the visit note. 

## 2014-11-01 NOTE — Progress Notes (Signed)
Subjective:    Patient ID: Suzanne Mcmillan, female    DOB: [birth year], 81 y.o.   MRN: [id number]  HPI  81YO female presents for acute visit.  Suzanne Mcmillan is unable to provide history. Her daughter reports they thought Suzanne Mcmillan had a "UTI" at her home. Urine culture in early August showed E. Coli. Daughter is unsure if patient was given Doxycycline as prescribed at that time.   Past Medical History  Diagnosis Date  . Depression   . Hypertension   . Dementia   . Allergy     Hay fever   Family History  Problem Relation Age of Onset  . Leukemia Mother   . Heart disease Sister   . Cancer Brother 37    stomach cancer  . Cancer Daughter     breast  . Arthritis Brother     severe rheumatoid arthritis  . Heart disease Sister    Past Surgical History  Procedure Laterality Date  . Tonsillectomy  1938  . Abdominal hysterectomy  1968  . Spine surgery  1983    ruptured disc lumbar  . Eye surgery  1992    cataract removal  . Breast surgery  1995    lumpectomy-left-benign   Social History   Social History  . Marital Status: Widowed    Spouse Name: N/A  . Number of Children: N/A  . Years of Education: N/A   Occupational History  . 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 Smoking status: Never Smoker   . Smokeless tobacco: None  . Alcohol Use: No  . Drug Use: No  . Sexual Activity: No   Other Topics Concern  . None   Social History Narrative   Lives in [place] in [facility]isted Living. Has daughter.      Hobbies : walking and reading   Exercise- walking     Review of Systems  Unable to perform ROS: Dementia       Objective:    BP 128/68 mmHg  Pulse 76  Ht 5' 5.75" (1.67 m)  SpO2 98% Physical Exam  Constitutional: Suzanne Mcmillan is oriented to person, place, and time. Suzanne Mcmillan appears well-developed and well-nourished. No distress.  HENT:  Head: Normocephalic and atraumatic.  Right Ear: External ear normal.  Left Ear: External ear normal.    Nose: Nose normal.  Mouth/Throat: Oropharynx is clear and moist. No oropharyngeal exudate.  Eyes: Conjunctivae are normal. Pupils are equal, round, and reactive to light. Right eye exhibits no discharge. Left eye exhibits no discharge. No scleral icterus.  Neck: Normal range of motion. Neck supple. No tracheal deviation present. No thyromegaly present.  Cardiovascular: Normal rate, regular rhythm, normal heart sounds and intact distal pulses.  Exam reveals no gallop and no friction rub.   No murmur heard. Pulmonary/Chest: Effort normal and breath sounds normal. No respiratory distress. Suzanne Mcmillan has no wheezes. Suzanne Mcmillan has no rales. Suzanne Mcmillan exhibits no tenderness.  Musculoskeletal: Normal range of motion. Suzanne Mcmillan exhibits no edema or tenderness.  Lymphadenopathy:    Suzanne Mcmillan has no cervical adenopathy.  Neurological: Suzanne Mcmillan is alert and oriented to person, place, and time. No cranial nerve deficit. Suzanne Mcmillan exhibits normal muscle tone. Coordination normal.  Skin: Skin is warm and dry. No rash noted. Suzanne Mcmillan is not diaphoretic. No erythema. No pallor.  Psychiatric: Suzanne Mcmillan has a normal mood and affect. Her speech is normal and behavior is normal. Thought content normal. Cognition and memory are impaired. Suzanne Mcmillan expresses impulsivity and inappropriate judgment. Suzanne Mcmillan exhibits  abnormal recent memory and abnormal remote memory.          Assessment & Plan:  Over of which >50% spent in face-to-face contact with patient discussing plan of care  Problem List Items Addressed This Visit      Unprioritized   UTI (urinary tract infection) - Primary    Recent UTI, supposed to be treated with Doxycycline, however daughter and pt unclear if this has been treated. Will send message to home health to clarify. Pt unable to perform UA here in office today. Will try to obtain follow up sample at home.          Return if symptoms worsen or fail to improve.

## 2014-12-16 ENCOUNTER — Emergency Department
Admission: EM | Admit: 2014-12-16 | Discharge: 2014-12-16 | Disposition: A | Discharge status: Home / Self Care | Payer: PPO | Attending: Emergency Medicine | Admitting: Emergency Medicine

## 2014-12-16 ENCOUNTER — Encounter: Payer: Self-pay | Admitting: Emergency Medicine

## 2014-12-16 DIAGNOSIS — Z23 Encounter for immunization: Secondary | ICD-10-CM | POA: Diagnosis not present

## 2014-12-16 DIAGNOSIS — S61512A Laceration without foreign body of left wrist, initial encounter: Secondary | ICD-10-CM | POA: Diagnosis present

## 2014-12-16 DIAGNOSIS — Z7982 Long term (current) use of aspirin: Secondary | ICD-10-CM | POA: Diagnosis not present

## 2014-12-16 DIAGNOSIS — F0390 Unspecified dementia without behavioral disturbance: Secondary | ICD-10-CM | POA: Diagnosis not present

## 2014-12-16 DIAGNOSIS — Z880 Allergy status to penicillin: Secondary | ICD-10-CM | POA: Insufficient documentation

## 2014-12-16 DIAGNOSIS — Z792 Long term (current) use of antibiotics: Secondary | ICD-10-CM | POA: Insufficient documentation

## 2014-12-16 DIAGNOSIS — Y9389 Activity, other specified: Secondary | ICD-10-CM | POA: Diagnosis not present

## 2014-12-16 DIAGNOSIS — Y998 Other external cause status: Secondary | ICD-10-CM | POA: Diagnosis not present

## 2014-12-16 DIAGNOSIS — Z79899 Other long term (current) drug therapy: Secondary | ICD-10-CM | POA: Diagnosis not present

## 2014-12-16 DIAGNOSIS — Y9289 Other specified places as the place of occurrence of the external cause: Secondary | ICD-10-CM | POA: Insufficient documentation

## 2014-12-16 DIAGNOSIS — X58XXXA Exposure to other specified factors, initial encounter: Secondary | ICD-10-CM | POA: Insufficient documentation

## 2014-12-16 DIAGNOSIS — I10 Essential (primary) hypertension: Secondary | ICD-10-CM | POA: Diagnosis not present

## 2014-12-16 HISTORY — DX: Acute embolism and thrombosis of unspecified deep veins of unspecified lower extremity: I82.409

## 2014-12-16 HISTORY — DX: Non-ST elevation (NSTEMI) myocardial infarction: I21.4

## 2014-12-16 MED ORDER — TETANUS-DIPHTH-ACELL PERTUSSIS 5-2.5-18.5 LF-MCG/0.5 IM SUSP
0.5000 mL | Freq: Once | INTRAMUSCULAR | Status: AC
  Administered 2014-12-16: 0.5 mL via INTRAMUSCULAR
  Filled 2014-12-16: qty 0.5

## 2014-12-16 NOTE — Discharge Instructions (Signed)
Please seek medical attention for any high fevers, chest pain, shortness of breath, change in behavior, persistent vomiting, bloody stool or any other new or concerning symptoms. ° °Laceration Care, Adult °A laceration is a cut that goes through all of the layers of the skin and into the tissue that is right under the skin. Some lacerations heal on their own. Others need to be closed with stitches (sutures), staples, skin adhesive strips, or skin glue. Proper laceration care minimizes the risk of infection and helps the laceration to heal better. °HOW TO CARE FOR YOUR LACERATION °If sutures or staples were used: °· Keep the wound clean and dry. °· If you were given a bandage (dressing), you should change it at least one time per day or as told by your health care provider. You should also change it if it becomes wet or dirty. °· Keep the wound completely dry for the first 24 hours or as told by your health care provider. After that time, you may shower or bathe. However, make sure that the wound is not soaked in water until after the sutures or staples have been removed. °· Clean the wound one time each day or as told by your health care provider: °¨ Wash the wound with soap and water. °¨ Rinse the wound with water to remove all soap. °¨ Pat the wound dry with a clean towel. Do not rub the wound. °· After cleaning the wound, apply a thin layer of antibiotic ointment as told by your health care provider. This will help to prevent infection and keep the dressing from sticking to the wound. °· Have the sutures or staples removed as told by your health care provider. °If skin adhesive strips were used: °· Keep the wound clean and dry. °· If you were given a bandage (dressing), you should change it at least one time per day or as told by your health care provider. You should also change it if it becomes dirty or wet. °· Do not get the skin adhesive strips wet. You may shower or bathe, but be careful to keep the wound  dry. °· If the wound gets wet, pat it dry with a clean towel. Do not rub the wound. °· Skin adhesive strips fall off on their own. You may trim the strips as the wound heals. Do not remove skin adhesive strips that are still stuck to the wound. They will fall off in time. °If skin glue was used: °· Try to keep the wound dry, but you may briefly wet it in the shower or bath. Do not soak the wound in water, such as by swimming. °· After you have showered or bathed, gently pat the wound dry with a clean towel. Do not rub the wound. °· Do not do any activities that will make you sweat heavily until the skin glue has fallen off on its own. °· Do not apply liquid, cream, or ointment medicine to the wound while the skin glue is in place. Using those may loosen the film before the wound has healed. °· If you were given a bandage (dressing), you should change it at least one time per day or as told by your health care provider. You should also change it if it becomes dirty or wet. °· If a dressing is placed over the wound, be careful not to apply tape directly over the skin glue. Doing that may cause the glue to be pulled off before the wound has healed. °· Do not   pick at the glue. The skin glue usually remains in place for 5-10 days, then it falls off of the skin. °General Instructions °· Take over-the-counter and prescription medicines only as told by your health care provider. °· If you were prescribed an antibiotic medicine or ointment, take or apply it as told by your doctor. Do not stop using it even if your condition improves. °· To help prevent scarring, make sure to cover your wound with sunscreen whenever you are outside after stitches are removed, after adhesive strips are removed, or when glue remains in place and the wound is healed. Make sure to wear a sunscreen of at least 30 SPF. °· Do not scratch or pick at the wound. °· Keep all follow-up visits as told by your health care provider. This is  important. °· Check your wound every day for signs of infection. Watch for: °¨ Redness, swelling, or pain. °¨ Fluid, blood, or pus. °· Raise (elevate) the injured area above the level of your heart while you are sitting or lying down, if possible. °SEEK MEDICAL CARE IF: °· You received a tetanus shot and you have swelling, severe pain, redness, or bleeding at the injection site. °· You have a fever. °· A wound that was closed breaks open. °· You notice a bad smell coming from your wound or your dressing. °· You notice something coming out of the wound, such as wood or glass. °· Your pain is not controlled with medicine. °· You have increased redness, swelling, or pain at the site of your wound. °· You have fluid, blood, or pus coming from your wound. °· You notice a change in the color of your skin near your wound. °· You need to change the dressing frequently due to fluid, blood, or pus draining from the wound. °· You develop a new rash. °· You develop numbness around the wound. °SEEK IMMEDIATE MEDICAL CARE IF: °· You develop severe swelling around the wound. °· Your pain suddenly increases and is severe. °· You develop painful lumps near the wound or on skin that is anywhere on your body. °· You have a red streak going away from your wound. °· The wound is on your hand or foot and you cannot properly move a finger or toe. °· The wound is on your hand or foot and you notice that your fingers or toes look pale or bluish. °  °This information is not intended to replace advice given to you by your health care provider. Make sure you discuss any questions you have with your health care provider. °  °Document Released: 02/16/2005 Document Revised: 07/03/2014 Document Reviewed: 02/12/2014 °Elsevier Interactive Patient Education ©2016 Elsevier Inc. ° °

## 2014-12-16 NOTE — ED Notes (Signed)
Report called to [facility], given to Wellington HampshireJuanitta Walker, RN. Per Dorann LodgeJuanita pt is to be transported back to facility via EMS.

## 2014-12-16 NOTE — ED Notes (Signed)
NAD noted at this time. Pt resting in bed. Pt calm and cooperative with staff. Pt noted to be talking to the TV stating, "I don't drink".

## 2014-12-16 NOTE — ED Notes (Signed)
Daughter at bedside for D/C. NAD noted at this time. Pt to go back to [facility] via EMS.

## 2014-12-16 NOTE — ED Notes (Signed)
Pt given Tdap, pt tolerated well. Wound cleaned and approximated prior to applying steri strips and sterile dressing. No bleeding noted at this time. Pt remains calm and cooperative at this time.

## 2014-12-16 NOTE — ED Notes (Signed)
NAD noted at this time. Pt resting in bed at this time. Pt calm and cooperative with staff. Pt removed O2 probe from her finger. Respirations are noted to be even and unlabored.

## 2014-12-16 NOTE — ED Notes (Signed)
Patient presents to Emergency Department via EMS with complaints of skin tear to left hand.  [facility] with skin tear from suspected scratching and call EMS, pt with hx of dementia.

## 2014-12-16 NOTE — ED Provider Notes (Signed)
[facility] Emergency Department Provider Note   ____________________________________________  Time seen: 0730  I have reviewed the triage vital signs and the nursing notes.   HISTORY  Chief Complaint Abrasion   History limited by: Dementia   HPI Suzanne Mcmillan is a 81 y.o. female with history of dementia who presents to the emergency department because a small skin tear to her left hand. She was sent from living facility. The patient herself is demented and is unable to tell me how she got the skin tear. Per documentation perhaps she scratched it. The patient herself has no complaints. Denies any recent fevers, chest pain, shortness breath.   Past Medical History  Diagnosis Date  . Depression   . Hypertension   . Dementia   . Allergy     Hay fever  . NSTEMI (non-ST elevated myocardial infarction) (HCC)   . DVT (deep venous thrombosis) [facility])     Patient Active Problem List   Diagnosis Date Noted  . UTI (urinary tract infection) 11/01/2014  . CAD (coronary artery disease) 06/04/2014  . NSTEMI (non-ST elevated myocardial infarction) (HCC) 06/28/2013  . Fall 12/25/2011  . Anxiety 06/19/2011  . Depression 02/12/2011  . Insomnia 12/15/2010  . Alzheimer disease 07/06/2006  . Hypertension 07/06/2006    Past Surgical History  Procedure Laterality Date  . Tonsillectomy  1938  . Abdominal hysterectomy  1968  . Spine surgery  1983    ruptured disc lumbar  . Eye surgery  1992    cataract removal  . Breast surgery  1995    lumpectomy-left-benign    Current Outpatient Rx  Name  Route  Sig  Dispense  Refill  . amLODipine (NORVASC) 5 MG tablet   Oral   Take 5 mg by mouth daily.         Marland Kitchen aspirin EC 81 MG tablet   Oral   Take 1 tablet (81 mg total) by mouth daily.   90 tablet   3   . cetaphil (CETAPHIL) cream   Topical   Apply 1 application topically 2 (two) times daily.         Marland Kitchen doxycycline (VIBRAMYCIN) 100 MG capsule   Oral  Take 1 capsule (100 mg total) by mouth 2 (two) times daily.   14 capsule   0   . loperamide (IMODIUM) 2 MG capsule   Oral   Take 2 mg by mouth 4 (four) times daily as needed. For loose stools         . metoprolol tartrate (LOPRESSOR) 25 MG tablet   Oral   Take 0.5 tablets (12.5 mg total) by mouth 2 (two) times daily.         Marland Kitchen nystatin cream (MYCOSTATIN)   Topical   Apply 1 application topically 2 (two) times daily.         . risperiDONE (RISPERDAL) 1 MG tablet      Take 1 tablet by mouth at bedtime as needed for insomnia and agitation.   30 tablet   0   . EXPIRED: sertraline (ZOLOFT) 25 MG tablet   Oral   Take 1 tablet (25 mg total) by mouth daily.   30 tablet   3   . triamcinolone cream (KENALOG) 0.5 %   Topical   Apply topically 2 (two) times daily.   60 g   2     Allergies Penicillins  Family History  Problem Relation Age of Onset  . Leukemia Mother   . Heart  disease Sister   . Cancer Brother [id number]    stomach cancer  . Cancer Daughter     breast  . Arthritis Brother     severe rheumatoid arthritis  . Heart disease Sister     Social History Social History  Substance Use Topics  . Smoking status: Never Smoker   . Smokeless tobacco: None  . Alcohol Use: No    Review of Systems  Constitutional: Negative for fever. Cardiovascular: Negative for chest pain. Respiratory: Negative for shortness of breath. Gastrointestinal: Negative for abdominal pain, vomiting and diarrhea. Genitourinary: Negative for dysuria. Musculoskeletal: Negative for back pain. Skin: Negative for rash. Neurological: Negative for headaches, focal weakness or numbness.   10-point ROS otherwise negative.  ____________________________________________   PHYSICAL EXAM:  VITAL SIGNS: ED Triage Vitals  Enc Vitals Group     BP 12/16/14 0647 132/71 mmHg     Pulse Rate 12/16/14 0647 57     Resp 12/16/14 0647 16     Temp 12/16/14 0647 98.1 F (36.7 C)     Temp Source  12/16/14 0647 Oral     SpO2 12/16/14 0647 100 %     Weight 12/16/14 0647 146 lb 2.6 oz (66.3 kg)     Height 12/16/14 0647 5\' 6"  (1.676 m)   Constitutional: Alert and oriented to place. Not time or event.  Eyes: Conjunctivae are normal. PERRL. Normal extraocular movements. ENT   Head: Normocephalic and atraumatic.   Nose: No congestion/rhinnorhea.   Mouth/Throat: Mucous membranes are moist.   Neck: No stridor. Hematological/Lymphatic/Immunilogical: No cervical lymphadenopathy. Cardiovascular: Normal rate, regular rhythm.  No murmurs, rubs, or gallops. Respiratory: Normal respiratory effort without tachypnea nor retractions. Breath sounds are clear and equal bilaterally. No wheezes/rales/rhonchi. Gastrointestinal: Soft and nontender. No distention.  Genitourinary: Deferred Musculoskeletal: Normal range of motion in all extremities. No joint effusions.  No lower extremity tenderness nor edema. Neurologic:  Normal speech and language. No gross focal neurologic deficits are appreciated. Speech is normal.  Not completely oriented.  Skin:  Skin is warm, dry. Small skin tear to left dorsal wrist. Hemostatic.  Psychiatric: Mood and affect are normal. Speech and behavior are normal. Patient exhibits appropriate insight and judgment.  ____________________________________________    LABS (pertinent positives/negatives)  None  ____________________________________________   EKG  None  ____________________________________________    RADIOLOGY  None   ____________________________________________   PROCEDURES  Procedure(s) performed: None  Critical Care performed: No  ____________________________________________   INITIAL IMPRESSION / ASSESSMENT AND PLAN / ED COURSE  Pertinent labs & imaging results that were available during my care of the patient were reviewed by me and considered in my medical decision making (see chart for details).  Patient presents to the  emergency department today because of small skin tear to left wrist. No other injuries identified. Will have nurse put Steri-Strips on. Will update tdap.  ____________________________________________   FINAL CLINICAL IMPRESSION(S) / ED DIAGNOSES  Final diagnoses:  Tear of skin of wrist, left, initial encounter     Phineas SemenGraydon Cachet Mccutchen, MD 12/16/14 0745

## 2014-12-16 NOTE — ED Notes (Signed)
Juanita, RN called to discuss this RN's findings when adressing pt's soiled brief. Pt's urine noted to be dark and malodorous at this time.

## 2014-12-18 ENCOUNTER — Telehealth: Payer: Self-pay | Admitting: Internal Medicine

## 2014-12-18 NOTE — Telephone Encounter (Signed)
Drafted letter and faxed to Assisted living.

## 2014-12-18 NOTE — Telephone Encounter (Signed)
Please advise? We don't normally do in and out caths.

## 2014-12-18 NOTE — Telephone Encounter (Signed)
Liborio NixonJanice called from [facility] regarding pt needing to have a UA done and a in and out cath would be the best thing for her per [place]. She wants to know if it can be done here? Liborio NixonJanice [phone]. Thank You!

## 2014-12-18 NOTE — Telephone Encounter (Signed)
Spoke with Liborio Nixonjanice, they received your fax yesterday for an in and out cath.  They do not have a RN on staff to complete this so they would like an order for Home Health nursing to follow patient for In and out cath and follow up for UTI.  [facility].  Patient was in the ED two days ago and they were unable to obtain a specimen as she was agitated.    Order needs to be faxed to [phone] attn: Liborio NixonJanice.

## 2014-12-18 NOTE — Telephone Encounter (Signed)
Fine to send order. 

## 2014-12-18 NOTE — Telephone Encounter (Signed)
I signed off yesterday for her to have this done at the nursing facility if they were really worried about UTI. We do not have catheters here

## 2014-12-25 ENCOUNTER — Other Ambulatory Visit: Payer: Self-pay | Admitting: *Deleted

## 2014-12-25 MED ORDER — SULFAMETHOXAZOLE-TRIMETHOPRIM 400-80 MG PO TABS: 1.0000 | ORAL_TABLET | Freq: Two times a day (BID) | ORAL | Status: DC

## 2015-01-05 ENCOUNTER — Emergency Department
Admission: EM | Admit: 2015-01-05 | Discharge: 2015-01-05 | Disposition: A | Discharge status: Home / Self Care | Payer: PPO | Attending: Emergency Medicine | Admitting: Emergency Medicine

## 2015-01-05 ENCOUNTER — Encounter: Payer: Self-pay | Admitting: Emergency Medicine

## 2015-01-05 ENCOUNTER — Emergency Department: Payer: PPO

## 2015-01-05 DIAGNOSIS — Y9389 Activity, other specified: Secondary | ICD-10-CM | POA: Insufficient documentation

## 2015-01-05 DIAGNOSIS — S0990XA Unspecified injury of head, initial encounter: Secondary | ICD-10-CM | POA: Diagnosis not present

## 2015-01-05 DIAGNOSIS — Y998 Other external cause status: Secondary | ICD-10-CM | POA: Insufficient documentation

## 2015-01-05 DIAGNOSIS — W1839XA Other fall on same level, initial encounter: Secondary | ICD-10-CM | POA: Insufficient documentation

## 2015-01-05 DIAGNOSIS — Z880 Allergy status to penicillin: Secondary | ICD-10-CM | POA: Diagnosis not present

## 2015-01-05 DIAGNOSIS — Z792 Long term (current) use of antibiotics: Secondary | ICD-10-CM | POA: Insufficient documentation

## 2015-01-05 DIAGNOSIS — S51002A Unspecified open wound of left elbow, initial encounter: Secondary | ICD-10-CM | POA: Diagnosis not present

## 2015-01-05 DIAGNOSIS — I10 Essential (primary) hypertension: Secondary | ICD-10-CM | POA: Diagnosis not present

## 2015-01-05 DIAGNOSIS — S0101XA Laceration without foreign body of scalp, initial encounter: Secondary | ICD-10-CM | POA: Insufficient documentation

## 2015-01-05 DIAGNOSIS — Z79899 Other long term (current) drug therapy: Secondary | ICD-10-CM | POA: Diagnosis not present

## 2015-01-05 DIAGNOSIS — Z7982 Long term (current) use of aspirin: Secondary | ICD-10-CM | POA: Insufficient documentation

## 2015-01-05 DIAGNOSIS — S51012A Laceration without foreign body of left elbow, initial encounter: Secondary | ICD-10-CM

## 2015-01-05 DIAGNOSIS — Y9289 Other specified places as the place of occurrence of the external cause: Secondary | ICD-10-CM | POA: Diagnosis not present

## 2015-01-05 LAB — COMPREHENSIVE METABOLIC PANEL
ALK PHOS: 117 U/L (ref 38–126)
ALT: 14 U/L (ref 14–54)
ANION GAP: 3 — AB (ref 5–15)
AST: 25 U/L (ref 15–41)
Albumin: 3.7 g/dL (ref 3.5–5.0)
BILIRUBIN TOTAL: 0.6 mg/dL (ref 0.3–1.2)
BUN: 16 mg/dL (ref 6–20)
CALCIUM: 9 mg/dL (ref 8.9–10.3)
CO2: 25 mmol/L (ref 22–32)
CREATININE: 0.91 mg/dL (ref 0.44–1.00)
Chloride: 110 mmol/L (ref 101–111)
GFR calc non Af Amer: 51 mL/min — ABNORMAL LOW (ref >60)
GFR, EST AFRICAN AMERICAN: 59 mL/min — AB (ref >60)
GLUCOSE: 97 mg/dL (ref 65–99)
Potassium: 4.2 mmol/L (ref 3.5–5.1)
Sodium: 138 mmol/L (ref 135–145)
TOTAL PROTEIN: 7.7 g/dL (ref 6.5–8.1)

## 2015-01-05 LAB — CBC WITH DIFFERENTIAL/PLATELET
Basophils Absolute: 0 10*3/uL (ref 0–0.1)
Basophils Relative: 1 %
Eosinophils Absolute: 0.3 10*3/uL (ref 0–0.7)
Eosinophils Relative: 5 %
HEMATOCRIT: 42.7 % (ref 35.0–47.0)
HEMOGLOBIN: 14 g/dL (ref 12.0–16.0)
LYMPHS ABS: 1.4 10*3/uL (ref 1.0–3.6)
LYMPHS PCT: 21 %
MCH: 30.6 pg (ref 26.0–34.0)
MCHC: 32.8 g/dL (ref 32.0–36.0)
MCV: 93.3 fL (ref 80.0–100.0)
MONOS PCT: 8 %
Monocytes Absolute: 0.5 10*3/uL (ref 0.2–0.9)
NEUTROS ABS: 4.3 10*3/uL (ref 1.4–6.5)
NEUTROS PCT: 65 %
Platelets: 296 10*3/uL (ref 150–440)
RBC: 4.58 MIL/uL (ref 3.80–5.20)
RDW: 14.6 % — ABNORMAL HIGH (ref 11.5–14.5)
WBC: 6.6 10*3/uL (ref 3.6–11.0)

## 2015-01-05 LAB — URINALYSIS COMPLETE WITH MICROSCOPIC (ARMC ONLY)
BILIRUBIN URINE: NEGATIVE
Bacteria, UA: NONE SEEN
GLUCOSE, UA: NEGATIVE mg/dL
Hgb urine dipstick: NEGATIVE
Ketones, ur: NEGATIVE mg/dL
LEUKOCYTES UA: NEGATIVE
Nitrite: NEGATIVE
Protein, ur: NEGATIVE mg/dL
Specific Gravity, Urine: 1.012 (ref 1.005–1.030)
pH: 6 (ref 5.0–8.0)

## 2015-01-05 LAB — TROPONIN I: Troponin I: 0.03 ng/mL (ref <0.031)

## 2015-01-05 NOTE — ED Notes (Addendum)
Supervisor at [facility] called Mervyn Gay(Lora @ [phone]) and notified of pt's discharge. Supervisor stated she is coming to pick pt up for transport back to facility.

## 2015-01-05 NOTE — Discharge Instructions (Signed)
Head Injury, Adult °You have a head injury. Headaches and throwing up (vomiting) are common after a head injury. It should be easy to wake up from sleeping. Sometimes you must stay in the hospital. Most problems happen within the first 24 hours. Side effects may occur up to 7-10 days after the injury.  °WHAT ARE THE TYPES OF HEAD INJURIES? °Head injuries can be as minor as a bump. Some head injuries can be more severe. More severe head injuries include: °· A jarring injury to the brain (concussion). °· A bruise of the brain (contusion). This mean there is bleeding in the brain that can cause swelling. °· A cracked skull (skull fracture). °· Bleeding in the brain that collects, clots, and forms a bump (hematoma). °WHEN SHOULD I GET HELP RIGHT AWAY?  °· You are confused or sleepy. °· You cannot be woken up. °· You feel sick to your stomach (nauseous) or keep throwing up (vomiting). °· Your dizziness or unsteadiness is getting worse. °· You have very bad, lasting headaches that are not helped by medicine. Take medicines only as told by your doctor. °· You cannot use your arms or legs like normal. °· You cannot walk. °· You notice changes in the black spots in the center of the colored part of your eye (pupil). °· You have clear or bloody fluid coming from your nose or ears. °· You have trouble seeing. °During the next 24 hours after the injury, you must stay with someone who can watch you. This person should get help right away (call 911 in the [place] if you start to shake and are not able to control it (have seizures), you pass out, or you are unable to wake up. °HOW CAN I PREVENT A HEAD INJURY IN THE FUTURE? °· Wear seat belts. °· Wear a helmet while bike riding and playing sports like football. °· Stay away from dangerous activities around the house. °WHEN CAN I RETURN TO NORMAL ACTIVITIES AND ATHLETICS? °See your doctor before doing these activities. You should not do normal activities or play contact sports until 1  week after the following symptoms have stopped: °· Headache that does not go away. °· Dizziness. °· Poor attention. °· Confusion. °· Memory problems. °· Sickness to your stomach or throwing up. °· Tiredness. °· Fussiness. °· Bothered by bright lights or loud noises. °· Anxiousness or depression. °· Restless sleep. °MAKE SURE YOU:  °· Understand these instructions. °· Will watch your condition. °· Will get help right away if you are not doing well or get worse. °  °This information is not intended to replace advice given to you by your health care provider. Make sure you discuss any questions you have with your health care provider. °  °Document Released: 01/30/2008 Document Revised: 03/09/2014 Document Reviewed: 10/24/2012 °Elsevier Interactive Patient Education ©2016 Elsevier Inc. ° °Laceration Care, Adult °A laceration is a cut that goes through all layers of the skin. The cut also goes into the tissue that is right under the skin. Some cuts heal on their own. Others need to be closed with stitches (sutures), staples, skin adhesive strips, or wound glue. Taking care of your cut lowers your risk of infection and helps your cut to heal better. °HOW TO TAKE CARE OF YOUR CUT °For stitches or staples: °· Keep the wound clean and dry. °· If you were given a bandage (dressing), you should change it at least one time per day or as told by your doctor. You should also   change it if it gets wet or dirty. °· Keep the wound completely dry for the first 24 hours or as told by your doctor. After that time, you may take a shower or a bath. However, make sure that the wound is not soaked in water until after the stitches or staples have been removed. °· Clean the wound one time each day or as told by your doctor: °¨ Wash the wound with soap and water. °¨ Rinse the wound with water until all of the soap comes off. °¨ Pat the wound dry with a clean towel. Do not rub the wound. °· After you clean the wound, put a thin layer of  antibiotic ointment on it as told by your doctor. This ointment: °¨ Helps to prevent infection. °¨ Keeps the bandage from sticking to the wound. °· Have your stitches or staples removed as told by your doctor. °If your doctor used skin adhesive strips:  °· Keep the wound clean and dry. °· If you were given a bandage, you should change it at least one time per day or as told by your doctor. You should also change it if it gets dirty or wet. °· Do not get the skin adhesive strips wet. You can take a shower or a bath, but be careful to keep the wound dry. °· If the wound gets wet, pat it dry with a clean towel. Do not rub the wound. °· Skin adhesive strips fall off on their own. You can trim the strips as the wound heals. Do not remove any strips that are still stuck to the wound. They will fall off after a while. °If your doctor used wound glue: °· Try to keep your wound dry, but you may briefly wet it in the shower or bath. Do not soak the wound in water, such as by swimming. °· After you take a shower or a bath, gently pat the wound dry with a clean towel. Do not rub the wound. °· Do not do any activities that will make you really sweaty until the skin glue has fallen off on its own. °· Do not apply liquid, cream, or ointment medicine to your wound while the skin glue is still on. °· If you were given a bandage, you should change it at least one time per day or as told by your doctor. You should also change it if it gets dirty or wet. °· If a bandage is placed over the wound, do not let the tape for the bandage touch the skin glue. °· Do not pick at the glue. The skin glue usually stays on for 5-10 days. Then, it falls off of the skin. °General Instructions  °· To help prevent scarring, make sure to cover your wound with sunscreen whenever you are outside after stitches are removed, after adhesive strips are removed, or when wound glue stays in place and the wound is healed. Make sure to wear a sunscreen of at least  30 SPF. °· Take over-the-counter and prescription medicines only as told by your doctor. °· If you were given antibiotic medicine or ointment, take or apply it as told by your doctor. Do not stop using the antibiotic even if your wound is getting better. °· Do not scratch or pick at the wound. °· Keep all follow-up visits as told by your doctor. This is important. °· Check your wound every day for signs of infection. Watch for: °¨ Redness, swelling, or pain. °¨ Fluid, blood, or pus. °·   Raise (elevate) the injured area above the level of your heart while you are sitting or lying down, if possible. °GET HELP IF: °· You got a tetanus shot and you have any of these problems at the injection site: °¨ Swelling. °¨ Very bad pain. °¨ Redness. °¨ Bleeding. °· You have a fever. °· A wound that was closed breaks open. °· You notice a bad smell coming from your wound or your bandage. °· You notice something coming out of the wound, such as wood or glass. °· Medicine does not help your pain. °· You have more redness, swelling, or pain at the site of your wound. °· You have fluid, blood, or pus coming from your wound. °· You notice a change in the color of your skin near your wound. °· You need to change the bandage often because fluid, blood, or pus is coming from the wound. °· You start to have a new rash. °· You start to have numbness around the wound. °GET HELP RIGHT AWAY IF: °· You have very bad swelling around the wound. °· Your pain suddenly gets worse and is very bad. °· You notice painful lumps near the wound or on skin that is anywhere on your body. °· You have a red streak going away from your wound. °· The wound is on your hand or foot and you cannot move a finger or toe like you usually can. °· The wound is on your hand or foot and you notice that your fingers or toes look pale or bluish. °  °This information is not intended to replace advice given to you by your health care provider. Make sure you discuss any  questions you have with your health care provider. °  °Document Released: 08/05/2007 Document Revised: 07/03/2014 Document Reviewed: 02/12/2014 °Elsevier Interactive Patient Education ©2016 Elsevier Inc. ° °

## 2015-01-05 NOTE — ED Notes (Addendum)
Discussed discharge instructions and follow-up care with patient's care giver Mervyn Gay(Lora, [facility] No questions or concerns at this time. Pt stable at discharge.

## 2015-01-05 NOTE — ED Notes (Signed)
Patient transported to CT 

## 2015-01-05 NOTE — ED Provider Notes (Signed)
[facility] Emergency Department Provider Note  ____________________________________________  Time seen: Approximately 1:56 PM  I have reviewed the triage vital signs and the nursing notes.   HISTORY  Chief Complaint Laceration   HPI Suzanne Mcmillan is a 81 y.o. female who presents to the emergency department today for an evaluation after falling. Patient is confused at baseline and is unable to provide a good history. The fall was witnessed and she did not lose consciousness. She has a superficial laceration to the scalp just behind the left ear as well as the left elbow. Patient denies pain other than "my head."   Past Medical History  Diagnosis Date  . Depression   . Hypertension   . Dementia   . Allergy     Hay fever  . NSTEMI (non-ST elevated myocardial infarction) (HCC)   . DVT (deep venous thrombosis) [facility](HCC)     Patient Active Problem List   Diagnosis Date Noted  . UTI (urinary tract infection) 11/01/2014  . CAD (coronary artery disease) 06/04/2014  . NSTEMI (non-ST elevated myocardial infarction) (HCC) 06/28/2013  . Fall 12/25/2011  . Anxiety 06/19/2011  . Depression 02/12/2011  . Insomnia 12/15/2010  . Alzheimer disease 07/06/2006  . Hypertension 07/06/2006    Past Surgical History  Procedure Laterality Date  . Tonsillectomy  1938  . Abdominal hysterectomy  1968  . Spine surgery  1983    ruptured disc lumbar  . Eye surgery  1992    cataract removal  . Breast surgery  1995    lumpectomy-left-benign    Current Outpatient Rx  Name  Route  Sig  Dispense  Refill  . amLODipine (NORVASC) 5 MG tablet   Oral   Take 5 mg by mouth daily.         Marland Kitchen. aspirin EC 81 MG tablet   Oral   Take 1 tablet (81 mg total) by mouth daily.   90 tablet   3   . cetaphil (CETAPHIL) cream   Topical   Apply 1 application topically 2 (two) times daily.         Marland Kitchen. doxycycline (VIBRAMYCIN) 100 MG capsule   Oral   Take 1 capsule (100 mg total) by  mouth 2 (two) times daily.   14 capsule   0   . loperamide (IMODIUM) 2 MG capsule   Oral   Take 2 mg by mouth 4 (four) times daily as needed. For loose stools         . metoprolol tartrate (LOPRESSOR) 25 MG tablet   Oral   Take 0.5 tablets (12.5 mg total) by mouth 2 (two) times daily.         Marland Kitchen. nystatin cream (MYCOSTATIN)   Topical   Apply 1 application topically 2 (two) times daily.         . risperiDONE (RISPERDAL) 1 MG tablet      Take 1 tablet by mouth at bedtime as needed for insomnia and agitation.   30 tablet   0   . EXPIRED: sertraline (ZOLOFT) 25 MG tablet   Oral   Take 1 tablet (25 mg total) by mouth daily.   30 tablet   3   . sulfamethoxazole-trimethoprim (BACTRIM) 400-80 MG tablet   Oral   Take 1 tablet by mouth 2 (two) times daily.   14 tablet   0   . triamcinolone cream (KENALOG) 0.5 %   Topical   Apply topically 2 (two) times daily.   60 g  2     Allergies Penicillins  Family History  Problem Relation Age of Onset  . Leukemia Mother   . Heart disease Sister   . Cancer Brother 81    stomach cancer  . Cancer Daughter     breast  . Arthritis Brother     severe rheumatoid arthritis  . Heart disease Sister     Social History Social History  Substance Use Topics  . Smoking status: Never Smoker   . Smokeless tobacco: None  . Alcohol Use: No    Review of Systems Constitutional: No fever/chills.  Eyes: No visual changes. ENT: No sore throat. Cardiovascular: Denies chest pain. Respiratory: Denies shortness of breath. Gastrointestinal: No abdominal pain.  No nausea, no vomiting.  No diarrhea.  No constipation. Genitourinary: Negative for dysuria. Musculoskeletal: Negative for back pain. Skin: Negative for rash. Neurological: Negative for headaches, focal weakness or numbness.  10-point ROS otherwise negative.  ____________________________________________   PHYSICAL EXAM:  VITAL SIGNS: ED Triage Vitals  Enc Vitals Group      BP 01/05/15 1318 148/62 mmHg     Pulse Rate 01/05/15 1318 72     Resp 01/05/15 1318 12     Temp 01/05/15 1318 97.7 F (36.5 C)     Temp Source 01/05/15 1318 Oral     SpO2 01/05/15 1318 100 %     Weight --      Height --      Head Cir --      Peak Flow --      Pain Score --      Pain Loc --      Pain Edu? --      Excl. in GC? --     Constitutional: Alert and oriented and to person. Well appearing and in no acute distress. Eyes: Conjunctivae are normal. PERRL. EOMI. Head: Atraumatic. Nose: No congestion/rhinnorhea. Mouth/Throat: Mucous membranes are moist.  Oropharynx non-erythematous. Neck: No stridor.  No cervical spine tenderness to palpation Cardiovascular: Normal rate, regular rhythm. Grossly normal heart sounds.  Good peripheral circulation. Respiratory: Normal respiratory effort.  No retractions. Lungs CTAB. Gastrointestinal: Soft and nontender. No distention. No abdominal bruits. No CVA tenderness. Musculoskeletal: No lower extremity tenderness nor edema.  No joint effusions. No focal midline tenderness to thoracic or lumbar spine Neurologic:  Normal speech and language. No gross focal neurologic deficits are appreciated. No gait instability. Skin:  Abrasion noted to the occipital area of the scalp just behind the left ear as well as left elbow Psychiatric: Mood and affect are normal. Speech and behavior are normal.  ____________________________________________   LABS (all labs ordered are listed, but only abnormal results are displayed)  Labs Reviewed  URINALYSIS COMPLETEWITH MICROSCOPIC ([facility] ONLY)  CBC WITH DIFFERENTIAL/PLATELET  COMPREHENSIVE METABOLIC PANEL   ____________________________________________  EKG  Sinus bradycardia with a rate of 52, right axis deviation, T-wave abnormality in V5 and V6, which is unchanged from EKG on 12/04/2014. ____________________________________________  RADIOLOGY  CT head negative for acute  abnormality. ____________________________________________   PROCEDURES  Procedure(s) performed:  LACERATION REPAIR Performed by: Kem Boroughs Authorized by: Kem Boroughs Consent: Verbal consent obtained. Risks and benefits: risks, benefits and alternatives were discussed Consent given by: patient Patient identity confirmed: provided demographic data Prepped and Draped in normal sterile fashion Wound explored  Laceration Location: left occiput  Laceration Length: 1cm  No Foreign Bodies seen or palpated  Amount of cleaning: standard  Skin closure: Skin Adhesive  Number of sutures: 0  Technique: Topical  Patient tolerance: Patient tolerated the procedure well with no immediate complications.   Critical Care performed: No  ____________________________________________   INITIAL IMPRESSION / ASSESSMENT AND PLAN / ED COURSE  Pertinent labs & imaging results that were available during my care of the patient were reviewed by me and considered in my medical decision making (see chart for details).  Patient to be discharged home after CT and labs unremarkable. She will follow up with the PCP or return to the ER for symptoms of concern. ____________________________________________   FINAL CLINICAL IMPRESSION(S) / ED DIAGNOSES  Final diagnoses:  None     Chinita Pester, FNP 01/05/15 1730  Jennye Moccasin, MD 01/09/15 [phone]

## 2015-01-05 NOTE — ED Notes (Signed)
Pt presents to ED via EMS c/o witnessed fall while trying to rise from chair during lunch at [facility]. Pt here to be evaluated for skin tears to LFA and behind L ear. Pt denies pain, denies LOC. Pt pleasantly confused, oriented to self and situation only.

## 2015-01-05 NOTE — ED Notes (Signed)
Patient transported back to room from CT 

## 2015-01-10 ENCOUNTER — Telehealth: Payer: Self-pay

## 2015-01-10 NOTE — Telephone Encounter (Signed)
Nursing facility called wanting a refill on nystatin cream. I authorize one request for patient.  Is this ok?

## 2015-01-10 NOTE — Telephone Encounter (Signed)
That is perfect. Thanks for handling this!

## 2015-01-17 ENCOUNTER — Other Ambulatory Visit: Payer: Self-pay

## 2015-01-17 MED ORDER — TRIAMCINOLONE ACETONIDE 0.5 % EX CREA: TOPICAL_CREAM | Freq: Two times a day (BID) | CUTANEOUS | Status: DC

## 2015-07-25 ENCOUNTER — Inpatient Hospital Stay: Payer: PPO | Admitting: Anesthesiology

## 2015-07-25 ENCOUNTER — Encounter: Admission: EM | Disposition: E | Payer: Self-pay | Source: Home / Self Care | Attending: Internal Medicine

## 2015-07-25 ENCOUNTER — Inpatient Hospital Stay
Admission: EM | Admit: 2015-07-25 | Discharge: 2015-08-01 | DRG: 389 | Disposition: E | Payer: PPO | Attending: Internal Medicine | Admitting: Internal Medicine

## 2015-07-25 ENCOUNTER — Encounter: Payer: Self-pay | Admitting: Emergency Medicine

## 2015-07-25 ENCOUNTER — Emergency Department: Payer: PPO

## 2015-07-25 DIAGNOSIS — Z90710 Acquired absence of both cervix and uterus: Secondary | ICD-10-CM

## 2015-07-25 DIAGNOSIS — Z86718 Personal history of other venous thrombosis and embolism: Secondary | ICD-10-CM

## 2015-07-25 DIAGNOSIS — Z800 Family history of malignant neoplasm of digestive organs: Secondary | ICD-10-CM | POA: Diagnosis not present

## 2015-07-25 DIAGNOSIS — Z66 Do not resuscitate: Secondary | ICD-10-CM | POA: Diagnosis present

## 2015-07-25 DIAGNOSIS — F329 Major depressive disorder, single episode, unspecified: Secondary | ICD-10-CM | POA: Diagnosis present

## 2015-07-25 DIAGNOSIS — Z7982 Long term (current) use of aspirin: Secondary | ICD-10-CM

## 2015-07-25 DIAGNOSIS — Z806 Family history of leukemia: Secondary | ICD-10-CM | POA: Diagnosis not present

## 2015-07-25 DIAGNOSIS — Z9889 Other specified postprocedural states: Secondary | ICD-10-CM

## 2015-07-25 DIAGNOSIS — Z803 Family history of malignant neoplasm of breast: Secondary | ICD-10-CM

## 2015-07-25 DIAGNOSIS — Z880 Allergy status to penicillin: Secondary | ICD-10-CM | POA: Diagnosis not present

## 2015-07-25 DIAGNOSIS — Z8249 Family history of ischemic heart disease and other diseases of the circulatory system: Secondary | ICD-10-CM | POA: Diagnosis not present

## 2015-07-25 DIAGNOSIS — K5939 Other megacolon: Secondary | ICD-10-CM | POA: Diagnosis present

## 2015-07-25 DIAGNOSIS — K5641 Fecal impaction: Secondary | ICD-10-CM | POA: Diagnosis not present

## 2015-07-25 DIAGNOSIS — K562 Volvulus: Principal | ICD-10-CM | POA: Insufficient documentation

## 2015-07-25 DIAGNOSIS — Z79899 Other long term (current) drug therapy: Secondary | ICD-10-CM

## 2015-07-25 DIAGNOSIS — Z8261 Family history of arthritis: Secondary | ICD-10-CM | POA: Diagnosis not present

## 2015-07-25 DIAGNOSIS — K56609 Unspecified intestinal obstruction, unspecified as to partial versus complete obstruction: Secondary | ICD-10-CM

## 2015-07-25 DIAGNOSIS — I252 Old myocardial infarction: Secondary | ICD-10-CM | POA: Diagnosis not present

## 2015-07-25 DIAGNOSIS — K624 Stenosis of anus and rectum: Secondary | ICD-10-CM | POA: Diagnosis present

## 2015-07-25 DIAGNOSIS — E039 Hypothyroidism, unspecified: Secondary | ICD-10-CM | POA: Diagnosis present

## 2015-07-25 DIAGNOSIS — I10 Essential (primary) hypertension: Secondary | ICD-10-CM | POA: Diagnosis present

## 2015-07-25 DIAGNOSIS — R1111 Vomiting without nausea: Secondary | ICD-10-CM

## 2015-07-25 DIAGNOSIS — I2510 Atherosclerotic heart disease of native coronary artery without angina pectoris: Secondary | ICD-10-CM | POA: Diagnosis present

## 2015-07-25 DIAGNOSIS — G309 Alzheimer's disease, unspecified: Secondary | ICD-10-CM | POA: Diagnosis present

## 2015-07-25 DIAGNOSIS — F0280 Dementia in other diseases classified elsewhere without behavioral disturbance: Secondary | ICD-10-CM | POA: Diagnosis present

## 2015-07-25 HISTORY — PX: FLEXIBLE SIGMOIDOSCOPY: SHX5431

## 2015-07-25 LAB — CBC WITH DIFFERENTIAL/PLATELET
BASOS ABS: 0 10*3/uL (ref 0–0.1)
Basophils Relative: 0 %
EOS ABS: 0.1 10*3/uL (ref 0–0.7)
HCT: 38.2 % (ref 35.0–47.0)
Hemoglobin: 12.9 g/dL (ref 12.0–16.0)
Lymphocytes Relative: 14 %
Lymphs Abs: 1.3 10*3/uL (ref 1.0–3.6)
MCH: 31.5 pg (ref 26.0–34.0)
MCHC: 33.8 g/dL (ref 32.0–36.0)
MCV: 93.1 fL (ref 80.0–100.0)
MONO ABS: 1.1 10*3/uL — AB (ref 0.2–0.9)
Monocytes Relative: 13 %
Neutro Abs: 6.3 10*3/uL (ref 1.4–6.5)
Neutrophils Relative %: 72 %
PLATELETS: 257 10*3/uL (ref 150–440)
RBC: 4.1 MIL/uL (ref 3.80–5.20)
RDW: 14.8 % — AB (ref 11.5–14.5)
WBC: 8.8 10*3/uL (ref 3.6–11.0)

## 2015-07-25 LAB — COMPREHENSIVE METABOLIC PANEL
ALT: 13 U/L — AB (ref 14–54)
AST: 27 U/L (ref 15–41)
Albumin: 3.5 g/dL (ref 3.5–5.0)
Alkaline Phosphatase: 90 U/L (ref 38–126)
Anion gap: 5 (ref 5–15)
BUN: 19 mg/dL (ref 6–20)
CHLORIDE: 108 mmol/L (ref 101–111)
CO2: 27 mmol/L (ref 22–32)
CREATININE: 1.02 mg/dL — AB (ref 0.44–1.00)
Calcium: 9.1 mg/dL (ref 8.9–10.3)
GFR, EST AFRICAN AMERICAN: 51 mL/min — AB (ref >60)
GFR, EST NON AFRICAN AMERICAN: 44 mL/min — AB (ref >60)
Glucose, Bld: 95 mg/dL (ref 65–99)
POTASSIUM: 4.5 mmol/L (ref 3.5–5.1)
SODIUM: 140 mmol/L (ref 135–145)
Total Bilirubin: 1.2 mg/dL (ref 0.3–1.2)
Total Protein: 7.6 g/dL (ref 6.5–8.1)

## 2015-07-25 LAB — LIPASE, BLOOD: LIPASE: 23 U/L (ref 11–51)

## 2015-07-25 LAB — MRSA PCR SCREENING: MRSA BY PCR: NEGATIVE

## 2015-07-25 SURGERY — SIGMOIDOSCOPY, FLEXIBLE
Anesthesia: General

## 2015-07-25 MED ORDER — LEVOTHYROXINE SODIUM 25 MCG PO TABS: 25.0000 ug | ORAL_TABLET | ORAL | Status: DC

## 2015-07-25 MED ORDER — HEPARIN SODIUM (PORCINE) 5000 UNIT/ML IJ SOLN
5000.0000 [IU] | Freq: Three times a day (TID) | INTRAMUSCULAR | Status: DC
  Administered 2015-07-25 – 2015-07-26 (×3): 5000 [IU] via SUBCUTANEOUS
  Filled 2015-07-25 (×3): qty 1

## 2015-07-25 MED ORDER — PROPOFOL 10 MG/ML IV BOLUS
INTRAVENOUS | Status: DC | PRN
  Administered 2015-07-25: 30 mg via INTRAVENOUS

## 2015-07-25 MED ORDER — KETAMINE HCL 10 MG/ML IJ SOLN
INTRAMUSCULAR | Status: DC | PRN
  Administered 2015-07-25: 20 mg via INTRAVENOUS

## 2015-07-25 MED ORDER — LIDOCAINE HCL (CARDIAC) 20 MG/ML IV SOLN
INTRAVENOUS | Status: DC | PRN
  Administered 2015-07-25: 60 mg via INTRAVENOUS

## 2015-07-25 MED ORDER — SODIUM CHLORIDE 0.9 % IV SOLN
INTRAVENOUS | Status: DC
  Administered 2015-07-25 – 2015-07-27 (×4): via INTRAVENOUS

## 2015-07-25 MED ORDER — SERTRALINE HCL 50 MG PO TABS: 50.0000 mg | ORAL_TABLET | Freq: Every day | ORAL | Status: DC

## 2015-07-25 MED ORDER — PROPOFOL 500 MG/50ML IV EMUL
INTRAVENOUS | Status: DC | PRN
  Administered 2015-07-25: 40 ug/kg/min via INTRAVENOUS

## 2015-07-25 MED ORDER — ONDANSETRON HCL 4 MG/2ML IJ SOLN: 4.0000 mg | Freq: Four times a day (QID) | INTRAMUSCULAR | Status: DC | PRN

## 2015-07-25 NOTE — Transfer of Care (Signed)
Immediate Anesthesia Transfer of Care Note  Patient: Suzanne Mcmillan  Procedure(s) Performed: Procedure(s): FLEXIBLE SIGMOIDOSCOPY (N/A)  Patient Location: PACU  Anesthesia Type:General  Level of Consciousness: awake and patient cooperative  Airway & Oxygen Therapy: Patient Spontanous Breathing  Post-op Assessment: Report given to RN and Post -op Vital signs reviewed and stable  Post vital signs: Reviewed and stable  Last Vitals:  Filed Vitals:   07/28/2015 1653 07/15/2015 1845  BP: 144/56 92/46  Pulse: 71 62  Temp: 36.4 C 36.6 C  Resp: 16 18    Last Pain: There were no vitals filed for this visit.       Complications: No apparent anesthesia complications

## 2015-07-25 NOTE — ED Notes (Signed)
Pt arrived by EMS after staff at [facility] called stating that the pt has been vomiting for 2 days. Pt denies pain and nausea. Pt states "I feel fine".

## 2015-07-25 NOTE — Consult Note (Signed)
[facility]  [address] [place], [place] [address] Phone: [phone] Fax : [phone]  Consultation  Referring Provider:     No ref. provider found Primary Care Physician:  Suzanne Dove, MD Primary Gastroenterologist:  None         Reason for Consultation:     Sigmoid volvulus  Date of Admission:  07/16/2015 Date of Consultation:  07/15/2015         HPI:   Suzanne Mcmillan is a 82 y.o. female who was admitted with abdominal distention and found to have a sigmoid volvulus. The patient is here with her daughter. There is no report of this happening in the past. The patient has been having vomiting for the last 2 days.  Past Medical History  Diagnosis Date  . Depression   . Hypertension   . Dementia   . Allergy     Hay fever  . NSTEMI (non-ST elevated myocardial infarction) (HCC)   . DVT (deep venous thrombosis) [facility])     Past Surgical History  Procedure Laterality Date  . Tonsillectomy  1938  . Abdominal hysterectomy  1968  . Spine surgery  1983    ruptured disc lumbar  . Eye surgery  1992    cataract removal  . Breast surgery  1995    lumpectomy-left-benign    Prior to Admission medications   Medication Sig Start Date End Date Taking? Authorizing Provider  cetaphil (CETAPHIL) cream Apply 1 application topically 2 (two) times daily.   Yes Historical Provider, MD  levothyroxine (SYNTHROID, LEVOTHROID) 25 MCG tablet Take 25 mcg by mouth daily.   Yes Historical Provider, MD  metoprolol tartrate (LOPRESSOR) 25 MG tablet Take 0.5 tablets (12.5 mg total) by mouth 2 (two) times daily. 06/28/13  Yes Suzanne K Kilroy, PA-C  sertraline (ZOLOFT) 50 MG tablet Take 50 mg by mouth daily.   Yes Historical Provider, MD    Family History  Problem Relation Age of Onset  . Leukemia Mother   . Heart disease Sister   . Cancer Brother 11    stomach cancer  . Cancer Daughter     breast  . Arthritis Brother     severe rheumatoid arthritis  . Heart  disease Sister      Social History  Substance Use Topics  . Smoking status: Never Smoker   . Smokeless tobacco: None  . Alcohol Use: No    Allergies as of 07/19/2015 - Review Complete 07/08/2015  Allergen Reaction Noted  . Penicillins      Review of Systems:    All systems reviewed and negative except where noted in HPI.   Physical Exam:  Vital signs in last 24 hours: Temp:  [97.5 F (36.4 C)-98 F (36.7 C)] 98 F (36.7 C) (05/25 1443) Pulse Rate:  [66-75] 66 (05/25 1443) Resp:  [16-18] 17 (05/25 1443) BP: (106-130)/(63-95) 129/67 mmHg (05/25 1443) SpO2:  [100 %] 100 % (05/25 1443) Weight:  [149 lb (67.586 kg)] 149 lb (67.586 kg) (05/25 1031)   General:   Pleasant, cooperative in NAD Head:  Normocephalic and atraumatic. Eyes:   No icterus.   Conjunctiva pink. PERRLA. Ears:  Hard of hearing Neck:  Supple; no masses or thyroidomegaly Lungs: Respirations even and unlabored. Lungs clear to auscultation bilaterally.   No wheezes, crackles, or rhonchi.  Heart:  Regular rate and rhythm;  Without murmur, clicks, rubs or gallops Abdomen:  Soft, distended, diffusely tender. Normal bowel sounds. No appreciable masses or hepatomegaly.  No  rebound or guarding.  Rectal:  Not performed. Msk:  Symmetrical without gross deformities.    Extremities:  Without edema, cyanosis or clubbing. Neurologic:  Alert and oriented x3;  grossly normal neurologically. Skin:  Intact without significant lesions or rashes. Cervical Nodes:  No significant cervical adenopathy. Psych:  Alert and cooperative. Normal affect.  LAB RESULTS:  Recent Labs  07/14/2015 1123  WBC 8.8  HGB 12.9  HCT 38.2  PLT 257   BMET  Recent Labs  07/26/2015 1123  NA 140  K 4.5  CL 108  CO2 27  GLUCOSE 95  BUN 19  CREATININE 1.02*  CALCIUM 9.1   LFT  Recent Labs  07/05/2015 1123  PROT 7.6  ALBUMIN 3.5  AST 27  ALT 13*  ALKPHOS 90  BILITOT 1.2   PT/INR No results for input(s): LABPROT, INR in the last  72 hours.  STUDIES: Dg Abd Acute W/chest  07/10/2015  CLINICAL DATA:  Vomiting for 2 days EXAM: DG ABDOMEN ACUTE W/ 1V CHEST COMPARISON:  06/18/2013 FINDINGS: Cardiomediastinal silhouette is stable. There is mild elevation of the left hemidiaphragm. Probable chronic mild interstitial prominence. No pulmonary edema. Mild left basilar atelectasis or scarring. There is no evidence of free abdominal air. Mild gaseous distended small bowel loops mid abdomen probable ileus. Significant gaseous distension of the colon. There is significant distension of the colon with gas and some liquid debris in mid lower abdomen up to 14 cm. Findings are highly suspicious for sigmoid volvulus. Paucity of bowel gas within rectum. IMPRESSION: No acute disease within chest. Significant gaseous distension of the colon up to 14 cm. Findings highly suspicious for colonic obstruction or sigmoid volvulus. No evidence of free abdominal air. Further correlation with CT scan of the abdomen and pelvis is recommended. These results were called by telephone at the time of interpretation on 07/23/2015 at 11:25 am to Dr. Phineas SemenGRAYDON GOODMAN , who verbally acknowledged these results. Electronically Signed   By: Natasha MeadLiviu  Pop M.D.   On: 07/04/2015 11:26      Impression / Plan:   Suzanne Mcmillan is a 82 y.o. y/o female with Sigmoid volvulus on x-ray. The patient has had nausea and vomiting for 2 days. The patient will be brought down for a flexible sigmoidoscopy for possible decompression of the sigmoid volvulus. The patient's daughter has been explained the plan and agrees with it.   Thank you for involving me in the care of this patient.      LOS: 0 days   Suzanne Miniumarren Adisen Bennion, MD  07/31/2015, 4:24 PM   Note: This dictation was prepared with Dragon dictation along with smaller phrase technology. Any transcriptional errors that result from this process are unintentional.

## 2015-07-25 NOTE — Op Note (Signed)
[facility] Patient Name: Suzanne Mcmillan Procedure Date: 06/08/8117 6:14 PM MRN: [id number] Account #: [id number] Date of Birth: 05-24-1915 Admit Type: Inpatient Age: 82 Room: [facility] ENDO ROOM 4 Gender: Female Note Status: Finalized Procedure:            Flexible Sigmoidoscopy Indications:          Colonic obstruction Providers:            Suzanne Minium, MD Referring MD:         Suzanne Mcmillan. Suzanne Humphreys, MD (Referring MD) Medicines:            Propofol per Anesthesia Complications:        No immediate complications. Procedure:            Pre-Anesthesia Assessment:                       - Prior to the procedure, a History and Physical was                        performed, and patient medications and allergies were                        reviewed. The patient's tolerance of previous                        anesthesia was also reviewed. The risks and benefits of                        the procedure and the sedation options and risks were                        discussed with the patient. All questions were                        answered, and informed consent was obtained. Prior                        Anticoagulants: The patient has taken no previous                        anticoagulant or antiplatelet agents. ASA Grade                        Assessment: III - A patient with severe systemic                        disease. After reviewing the risks and benefits, the                        patient was deemed in satisfactory condition to undergo                        the procedure.                       After obtaining informed consent, the scope was passed                        under direct vision. The Colonoscope was introduced  through the anus and advanced to the the descending                        colon. The flexible sigmoidoscopy was accomplished                        without difficulty. The patient tolerated the procedure              well. The quality of the bowel preparation was poor. Findings:      The digital rectal exam findings include rectal stricture.      The lumen of the rectum, recto-sigmoid colon and sigmoid colon was       grossly dilated. Decompression of the colon was attempted and was       successful, with complete decompression achieved. Impression:           - Preparation of the colon was poor.                       - Rectal stricture found on digital rectal exam.                       - Dilated in the rectum, in the recto-sigmoid colon and                        in the sigmoid colon.                       - No specimens collected. Recommendation:       - Rctl tube. Procedure Code(s):    --- Professional ---                       [id number], Sigmoidoscopy, flexible; with decompression (for                        pathologic distention) (eg, volvulus, megacolon),                        including placement of decompression tube, when                        performed Diagnosis Code(s):    --- Professional ---                       K56.60, Unspecified intestinal obstruction                       K62.4, Stenosis of anus and rectum                       K59.39, Other megacolon CPT copyright 2016 [employer]. All rights reserved. The codes documented in this report are preliminary and upon coder review may  be revised to meet current compliance requirements. Suzanne Miniumarren Suzanne Flagler, MD 09-05-2015 6:39:12 PM This report has been signed electronically. Number of Addenda: 0 Note Initiated On: 09-05-2015 6:14 PM Total Procedure Duration: 0 hours 10 minutes 26 seconds       [facility]

## 2015-07-25 NOTE — H&P (Signed)
[employer] - [place] at [facility]   PATIENT NAME: Suzanne Mcmillan    MR#:  [id number]  DATE OF BIRTH:  1915-06-09  DATE OF ADMISSION:  07/11/2015  PRIMARY CARE PHYSICIAN: Wynona Dove, MD   REQUESTING/REFERRING PHYSICIAN: Derrill Kay  CHIEF COMPLAINT:   Chief Complaint  Patient presents with  . Emesis    HISTORY OF PRESENT ILLNESS: [name]  is a 82 y.o. female with a known history of Depression, hypertension, non-ST elevation MI, DVT- lives in assisted living place. Sent to emergency room because of complaint of vomiting for last 2 days. She was noted to have abdominal distention and x-ray abdomen showed dilated colon on an possibility of volvulus of sigmoid. Patient denies any complaints and is comfortable, but she has dementia. ER physician spoke to GI on call doctor he would plan to do sigmoidoscopy later today to help with volvulus and suggested to admit the patient.  PAST MEDICAL HISTORY:   Past Medical History  Diagnosis Date  . Depression   . Hypertension   . Dementia   . Allergy     Hay fever  . NSTEMI (non-ST elevated myocardial infarction) (HCC)   . DVT (deep venous thrombosis) (HCC)     PAST SURGICAL HISTORY: Past Surgical History  Procedure Laterality Date  . Tonsillectomy  1938  . Abdominal hysterectomy  1968  . Spine surgery  1983    ruptured disc lumbar  . Eye surgery  1992    cataract removal  . Breast surgery  1995    lumpectomy-left-benign    SOCIAL HISTORY:  Social History  Substance Use Topics  . Smoking status: Never Smoker   . Smokeless tobacco: Not on file  . Alcohol Use: No    FAMILY HISTORY:  Family History  Problem Relation Age of Onset  . Leukemia Mother   . Heart disease Sister   . Cancer Brother 59    stomach cancer  . Cancer Daughter     breast  . Arthritis Brother     severe rheumatoid arthritis  . Heart disease Sister     DRUG ALLERGIES:  Allergies  Allergen Reactions  . Penicillins      REACTION: Rash, redness    REVIEW OF SYSTEMS:   Patient is not able to give review of system because she is demented.  MEDICATIONS AT HOME:  Prior to Admission medications   Medication Sig Start Date End Date Taking? Authorizing Provider  cetaphil (CETAPHIL) cream Apply 1 application topically 2 (two) times daily.   Yes Historical Provider, MD  levothyroxine (SYNTHROID, LEVOTHROID) 25 MCG tablet Take 25 mcg by mouth daily.   Yes Historical Provider, MD  metoprolol tartrate (LOPRESSOR) 25 MG tablet Take 0.5 tablets (12.5 mg total) by mouth 2 (two) times daily. 06/28/13  Yes Luke K Kilroy, PA-C  sertraline (ZOLOFT) 50 MG tablet Take 50 mg by mouth daily.   Yes Historical Provider, MD      PHYSICAL EXAMINATION:   VITAL SIGNS: Blood pressure 130/63, pulse 68, temperature 97.5 F (36.4 C), temperature source Oral, resp. rate 18, height  (1.702 m), weight 67.586 kg (149 lb), SpO2 100 %.  GENERAL:  82 y.o.-year-old patient lying in the bed with no acute distress.  EYES: Pupils equal, round, reactive to light and accommodation. No scleral icterus. Extraocular muscles intact.  HEENT: Head atraumatic, normocephalic. Oropharynx and nasopharynx clear.  NECK:  Supple, no jugular venous distention. No thyroid enlargement, no tenderness.  LUNGS: Normal breath sounds bilaterally, no  wheezing, rales,rhonchi or crepitation. No use of accessory muscles of respiration.  CARDIOVASCULAR: S1, S2 normal. No murmurs, rubs, or gallops.  ABDOMEN: Soft, nontender, distended. Bowel sounds absent. No organomegaly or mass.  EXTREMITIES: No pedal edema, cyanosis, or clubbing.  NEUROLOGIC: Cranial nerves II through XII are intact. Muscle strength 4/5 in all extremities. Sensation intact. Gait not checked.  PSYCHIATRIC: The patient is alert and not oriented, but Calm and comfortable.  SKIN: No obvious rash, lesion, or ulcer.   LABORATORY PANEL:   CBC  Recent Labs Lab 07/14/2015 1123  WBC 8.8  HGB 12.9   HCT 38.2  PLT 257  MCV 93.1  MCH 31.5  MCHC 33.8  RDW 14.8*  LYMPHSABS 1.3  MONOABS 1.1*  EOSABS 0.1  BASOSABS 0.0   ------------------------------------------------------------------------------------------------------------------  Chemistries   Recent Labs Lab 07/01/2015 1123  NA 140  K 4.5  CL 108  CO2 27  GLUCOSE 95  BUN 19  CREATININE 1.02*  CALCIUM 9.1  AST 27  ALT 13*  ALKPHOS 90  BILITOT 1.2   ------------------------------------------------------------------------------------------------------------------ estimated creatinine clearance is 29.9 mL/min (by C-G formula based on Cr of 1.02). ------------------------------------------------------------------------------------------------------------------ No results for input(s): TSH, T4TOTAL, T3FREE, THYROIDAB in the last 72 hours.  Invalid input(s): FREET3   Coagulation profile No results for input(s): INR, PROTIME in the last 168 hours. ------------------------------------------------------------------------------------------------------------------- No results for input(s): DDIMER in the last 72 hours. -------------------------------------------------------------------------------------------------------------------  Cardiac Enzymes No results for input(s): CKMB, TROPONINI, MYOGLOBIN in the last 168 hours.  Invalid input(s): CK ------------------------------------------------------------------------------------------------------------------ Invalid input(s): POCBNP  ---------------------------------------------------------------------------------------------------------------  Urinalysis    Component Value Date/Time   COLORURINE YELLOW* 01/05/2015 1518   COLORURINE Yellow 12/03/2013 0131   APPEARANCEUR CLOUDY* 01/05/2015 1518   APPEARANCEUR Clear 12/03/2013 0131   LABSPEC 1.012 01/05/2015 1518   LABSPEC 1.012 12/03/2013 0131   PHURINE 6.0 01/05/2015 1518   PHURINE 6.0 12/03/2013 0131   GLUCOSEU  NEGATIVE 01/05/2015 1518   GLUCOSEU Negative 12/03/2013 0131   HGBUR NEGATIVE 01/05/2015 1518   HGBUR Negative 12/03/2013 0131   BILIRUBINUR NEGATIVE 01/05/2015 1518   BILIRUBINUR Negative 12/03/2013 0131   BILIRUBINUR neg 01/09/2013 1537   KETONESUR NEGATIVE 01/05/2015 1518   KETONESUR Negative 12/03/2013 0131   PROTEINUR NEGATIVE 01/05/2015 1518   PROTEINUR Negative 12/03/2013 0131   PROTEINUR trace 01/09/2013 1537   UROBILINOGEN 0.2 01/09/2013 1537   NITRITE NEGATIVE 01/05/2015 1518   NITRITE Negative 12/03/2013 0131   NITRITE pos 01/09/2013 1537   LEUKOCYTESUR NEGATIVE 01/05/2015 1518   LEUKOCYTESUR Negative 12/03/2013 0131     RADIOLOGY: Dg Abd Acute W/chest  07/18/2015  CLINICAL DATA:  Vomiting for 2 days EXAM: DG ABDOMEN ACUTE W/ 1V CHEST COMPARISON:  06/18/2013 FINDINGS: Cardiomediastinal silhouette is stable. There is mild elevation of the left hemidiaphragm. Probable chronic mild interstitial prominence. No pulmonary edema. Mild left basilar atelectasis or scarring. There is no evidence of free abdominal air. Mild gaseous distended small bowel loops mid abdomen probable ileus. Significant gaseous distension of the colon. There is significant distension of the colon with gas and some liquid debris in mid lower abdomen up to 14 cm. Findings are highly suspicious for sigmoid volvulus. Paucity of bowel gas within rectum. IMPRESSION: No acute disease within chest. Significant gaseous distension of the colon up to 14 cm. Findings highly suspicious for colonic obstruction or sigmoid volvulus. No evidence of free abdominal air. Further correlation with CT scan of the abdomen and pelvis is recommended. These results were called by telephone at the time of interpretation  on 02-10-2016 at 11:25 am to Dr. Phineas SemenGRAYDON GOODMAN , who verbally acknowledged these results. Electronically Signed   By: Natasha MeadLiviu  Pop M.D.   On: 012-01-2016 11:26    EKG: Orders placed or performed during the hospital encounter  of 01/05/15  . ED EKG  . ED EKG  . EKG    IMPRESSION AND PLAN:  * Intestinal obstruction, possible sigmoid volvulus  Admitted to hospital, keep nothing by mouth, IV fluids for support, Zofran as needed for vomiting.   ER physician spoke to GI doctor and he will do sigmoidoscopy today to help with volvulus.  * Hypothyroidism   Continue levothyroxine  * Hypertension   Continue metoprolol  * Depression   Until the Zoloft.  * Dementia   At baseline, chronic complaint.   All the records are reviewed and case discussed with ED provider. Management plans discussed with the patient, family and they are in agreement.  CODE STATUS: DO NOT RESUSCITATE Code Status History    This patient does not have a recorded code status. Please follow your organizational policy for patients in this situation.     I spoke to patient's daughter Ms. Chewning on phone, her cell phone number is [phone]. As per her patient has a DO NOT RESUSCITATE order at her assisted living place.  TOTAL TIME TAKING CARE OF THIS PATIENT: 45 minutes.    Altamese DillingVACHHANI, Mardee Clune M.D on 02-10-2016   Between 7am to 6pm - Pager - [phone]  After 6pm go to [email or web address] - password [email or web address]  [facility]  Office  [phone]  CC: Primary care physician; Wynona DoveWALKER,JENNIFER AZBELL, MD   Note: This dictation was prepared with Dragon dictation along with smaller phrase technology. Any transcriptional errors that result from this process are unintentional.

## 2015-07-25 NOTE — ED Provider Notes (Signed)
[facility] Emergency Department Provider Note    ____________________________________________  Time seen: ~1040  I have reviewed the triage vital signs and the nursing notes.   HISTORY  Chief Complaint Emesis   History limited by: Dementia   HPI Suzanne Mcmillan is a 82 y.o. female who comes from [facility] living in today because of concerns for vomiting. She has been vomiting for 2 days. Unfortunately the patient herself denies any vomiting. She denies any pain. She states she feels completely fine. She would like to go back to [facility].     Past Medical History  Diagnosis Date  . Depression   . Hypertension   . Dementia   . Allergy     Hay fever  . NSTEMI (non-ST elevated myocardial infarction) (HCC)   . DVT (deep venous thrombosis) [facility])     Patient Active Problem List   Diagnosis Date Noted  . UTI (urinary tract infection) 11/01/2014  . CAD (coronary artery disease) 06/04/2014  . NSTEMI (non-ST elevated myocardial infarction) (HCC) 06/28/2013  . Fall 12/25/2011  . Anxiety 06/19/2011  . Depression 02/12/2011  . Insomnia 12/15/2010  . Alzheimer disease 07/06/2006  . Hypertension 07/06/2006    Past Surgical History  Procedure Laterality Date  . Tonsillectomy  1938  . Abdominal hysterectomy  1968  . Spine surgery  1983    ruptured disc lumbar  . Eye surgery  1992    cataract removal  . Breast surgery  1995    lumpectomy-left-benign    Current Outpatient Rx  Name  Route  Sig  Dispense  Refill  . amLODipine (NORVASC) 5 MG tablet   Oral   Take 5 mg by mouth daily.         Marland Kitchen aspirin EC 81 MG tablet   Oral   Take 1 tablet (81 mg total) by mouth daily.   90 tablet   3   . cetaphil (CETAPHIL) cream   Topical   Apply 1 application topically 2 (two) times daily.         Marland Kitchen doxycycline (VIBRAMYCIN) 100 MG capsule   Oral   Take 1 capsule (100 mg total) by mouth 2 (two) times daily.   14 capsule   0   . loperamide  (IMODIUM) 2 MG capsule   Oral   Take 2 mg by mouth 4 (four) times daily as needed. For loose stools         . metoprolol tartrate (LOPRESSOR) 25 MG tablet   Oral   Take 0.5 tablets (12.5 mg total) by mouth 2 (two) times daily.         Marland Kitchen nystatin cream (MYCOSTATIN)   Topical   Apply 1 application topically 2 (two) times daily.         . risperiDONE (RISPERDAL) 1 MG tablet      Take 1 tablet by mouth at bedtime as needed for insomnia and agitation.   30 tablet   0   . EXPIRED: sertraline (ZOLOFT) 25 MG tablet   Oral   Take 1 tablet (25 mg total) by mouth daily.   30 tablet   3   . sulfamethoxazole-trimethoprim (BACTRIM) 400-80 MG tablet   Oral   Take 1 tablet by mouth 2 (two) times daily.   14 tablet   0   . triamcinolone cream (KENALOG) 0.5 %   Topical   Apply topically 2 (two) times daily.   60 g   2     Allergies Penicillins  Family History  Problem Relation Age of Onset  . Leukemia Mother   . Heart disease Sister   . Cancer Brother 79    stomach cancer  . Cancer Daughter     breast  . Arthritis Brother     severe rheumatoid arthritis  . Heart disease Sister     Social History Social History  Substance Use Topics  . Smoking status: Never Smoker   . Smokeless tobacco: None  . Alcohol Use: No    Review of Systems  Constitutional: Negative for fever. Cardiovascular: Negative for chest pain. Respiratory: Negative for shortness of breath. Gastrointestinal: Negative for abdominal pain, vomiting and diarrhea. Genitourinary: Negative for dysuria. Musculoskeletal: Negative for back pain. Skin: Negative for rash. Neurological: Negative for headaches, focal weakness or numbness.   10-point ROS otherwise negative.  ____________________________________________   PHYSICAL EXAM:  VITAL SIGNS: ED Triage Vitals  Enc Vitals Group     BP 07/03/2015 1026 130/63 mmHg     Pulse Rate 07/31/2015 1026 68     Resp 07/26/2015 1026 18     Temp 07/20/2015 1026  97.5 F (36.4 C)     Temp Source 07/22/2015 1026 Oral     SpO2 07/08/2015 1026 100 %     Weight 07/17/2015 1031 149 lb (67.586 kg)     Height 07/07/2015 1031 5\' 7"  (1.702 m)   Constitutional: Alert and oriented. Well appearing and in no distress. Eyes: Conjunctivae are normal. PERRL. Normal extraocular movements. ENT   Head: Normocephalic and atraumatic.   Nose: No congestion/rhinnorhea.   Mouth/Throat: Mucous membranes are moist.   Neck: No stridor. Hematological/Lymphatic/Immunilogical: No cervical lymphadenopathy. Cardiovascular: Normal rate, regular rhythm.  No murmurs, rubs, or gallops. Respiratory: Normal respiratory effort without tachypnea nor retractions. Breath sounds are clear and equal bilaterally. No wheezes/rales/rhonchi. Gastrointestinal: Soft. Distended. Tympanitic. Nontender. Genitourinary: Deferred Musculoskeletal: Normal range of motion in all extremities. No joint effusions.  No lower extremity tenderness nor edema. Neurologic:  Normal speech and language. No gross focal neurologic deficits are appreciated.  Skin:  Skin is warm, dry and intact. No rash noted. Psychiatric: Mood and affect are normal. Speech and behavior are normal. Patient exhibits appropriate insight and judgment.  ____________________________________________    LABS (pertinent positives/negatives)  Labs Reviewed  CBC WITH DIFFERENTIAL/PLATELET - Abnormal; Notable for the following:    RDW 14.8 (*)    Monocytes Absolute 1.1 (*)    All other components within normal limits  COMPREHENSIVE METABOLIC PANEL - Abnormal; Notable for the following:    Creatinine, Ser 1.02 (*)    ALT 13 (*)    GFR calc non Af Amer 44 (*)    GFR calc Af Amer 51 (*)    All other components within normal limits  LIPASE, BLOOD  URINALYSIS COMPLETEWITH MICROSCOPIC ([facility] ONLY)     ____________________________________________   EKG  None  ____________________________________________    RADIOLOGY  Abd  acute IMPRESSION: No acute disease within chest. Significant gaseous distension of the colon up to 14 cm. Findings highly suspicious for colonic obstruction or sigmoid volvulus. No evidence of free abdominal air. Further correlation with CT scan of the abdomen and pelvis is recommended.   ____________________________________________   PROCEDURES  Procedure(s) performed: None  Critical Care performed: No  ____________________________________________   INITIAL IMPRESSION / ASSESSMENT AND PLAN / ED COURSE  Pertinent labs & imaging results that were available during my care of the patient were reviewed by me and considered in my medical decision making (see chart for  details).  Patient comes from living facility today because of concerns for vomiting for the past 2 days. Patient denies any symptoms. Will plan on getting blood work and abdominal x-rays given the physical exam.  ----------------------------------------- 12:32 PM on 07/30/2015 -----------------------------------------  Abdominal x-rays were concerning for sigmoid volvulus. This point I have discussed with Dr. Servando SnareWohl with GI who will come and evaluate the patient. The patient will be admitted to the hospitalist service. I discussed this plan with the daughter over the telephone.  ____________________________________________   FINAL CLINICAL IMPRESSION(S) / ED DIAGNOSES  Final diagnoses:  Vomiting without nausea, vomiting of unspecified type  Sigmoid volvulus (HCC)     Note: This dictation was prepared with Dragon dictation. Any transcriptional errors that result from this process are unintentional    Phineas SemenGraydon Mariabelen Pressly, MD 07/10/2015 1233

## 2015-07-25 NOTE — Anesthesia Preprocedure Evaluation (Signed)
Anesthesia Evaluation  Patient identified by MRN, date of birth, ID band Patient confused    Reviewed: Allergy & Precautions, NPO status , Patient's Chart, lab work & pertinent test results  History of Anesthesia Complications Negative for: history of anesthetic complications  Airway Mallampati: II       Dental  (+) Chipped, Poor Dentition   Pulmonary neg pulmonary ROS,           Cardiovascular hypertension, Pt. on medications + CAD and + Past MI       Neuro/Psych Anxiety Depression negative neurological ROS     GI/Hepatic negative GI ROS, Neg liver ROS,   Endo/Other  negative endocrine ROS  Renal/GU negative Renal ROS     Musculoskeletal   Abdominal   Peds  Hematology negative hematology ROS (+)   Anesthesia Other Findings   Reproductive/Obstetrics                             Anesthesia Physical Anesthesia Plan  ASA: III and emergent  Anesthesia Plan: General   Post-op Pain Management:    Induction: Intravenous  Airway Management Planned: Nasal Cannula  Additional Equipment:   Intra-op Plan:   Post-operative Plan:   Informed Consent: I have reviewed the patients History and Physical, chart, labs and discussed the procedure including the risks, benefits and alternatives for the proposed anesthesia with the patient or authorized representative who has indicated his/her understanding and acceptance.     Plan Discussed with:   Anesthesia Plan Comments:         Anesthesia Quick Evaluation

## 2015-07-25 NOTE — Care Management (Signed)
[facility].  Diagnosed with intestinal obstruction and possible volvulus.  CSW is aware patient is from facility

## 2015-07-25 NOTE — Anesthesia Postprocedure Evaluation (Signed)
Anesthesia Post Note  Patient: Suzanne Mcmillan  Procedure(s) Performed: Procedure(s) (LRB): FLEXIBLE SIGMOIDOSCOPY (N/A)  Patient location during evaluation: Endoscopy Anesthesia Type: General Level of consciousness: awake and alert Pain management: pain level controlled Vital Signs Assessment: post-procedure vital signs reviewed and stable Respiratory status: spontaneous breathing and respiratory function stable Cardiovascular status: stable Anesthetic complications: no    Last Vitals:  Filed Vitals:   02/03/2016 1915 02/03/2016 1938  BP: 135/57 122/41  Pulse: 61 65  Temp:  36.7 C  Resp: 20 16    Last Pain: There were no vitals filed for this visit.               Jennifermarie Franzen K

## 2015-07-26 ENCOUNTER — Inpatient Hospital Stay: Payer: PPO

## 2015-07-26 ENCOUNTER — Encounter: Payer: Self-pay | Admitting: Gastroenterology

## 2015-07-26 LAB — BASIC METABOLIC PANEL
Anion gap: 6 (ref 5–15)
BUN: 17 mg/dL (ref 6–20)
CO2: 24 mmol/L (ref 22–32)
Calcium: 8 mg/dL — ABNORMAL LOW (ref 8.9–10.3)
Chloride: 110 mmol/L (ref 101–111)
Creatinine, Ser: 0.88 mg/dL (ref 0.44–1.00)
GFR calc Af Amer: >60 mL/min (ref >60)
GFR calc non Af Amer: 53 mL/min — ABNORMAL LOW (ref >60)
Glucose, Bld: 72 mg/dL (ref 65–99)
Potassium: 3.6 mmol/L (ref 3.5–5.1)
SODIUM: 140 mmol/L (ref 135–145)

## 2015-07-26 LAB — CBC
HEMATOCRIT: 32.9 % — AB (ref 35.0–47.0)
HEMOGLOBIN: 11 g/dL — AB (ref 12.0–16.0)
MCH: 31.5 pg (ref 26.0–34.0)
MCHC: 33.3 g/dL (ref 32.0–36.0)
MCV: 94.5 fL (ref 80.0–100.0)
Platelets: 205 10*3/uL (ref 150–440)
RBC: 3.48 MIL/uL — AB (ref 3.80–5.20)
RDW: 14.3 % (ref 11.5–14.5)
WBC: 7.3 10*3/uL (ref 3.6–11.0)

## 2015-07-26 NOTE — Clinical Social Work Note (Signed)
Clinical Social Work Assessment  Patient Details  Name: Suzanne ButcherLyda H Mcmillan MRN: [id number] Date of Birth: 03/22/1915  Date of referral:  07/26/15               Reason for consult:  Facility Placement                Permission sought to share information with:    Permission granted to share information::     Name::        Agency::     Relationship::     Contact Information:     Housing/Transportation Living arrangements for the past 2 months:  Assisted Living Facility Source of Information:  Facility Patient Interpreter Needed:  None Criminal Activity/Legal Involvement Pertinent to Current Situation/Hospitalization:  No - Comment as needed Significant Relationships:  Adult Children Lives with:    Do you feel safe going back to the place where you live?    Need for family participation in patient care:     Care giving concerns:  [facility] and is dependent on them for her day to day care needs.   Social Worker assessment / plan:  CSW informed by staff that patient is a resident of [facility]. Patient had a procedure yesterday and may discharge back to [facility] over the weekend. CS[facility] and she stated that patient will be able to return regardless of mobility status and that patient is baseline wheelchair bound. Tammy said that if patient needs to return over the weekend, that we can call Liborio NixonJanice because she is on call this weekend: [phone].  Employment status:  Retired Database administratornsurance information:  Managed Medicare PT Recommendations:  Not assessed at this time Information / Referral to community resources:     Patient/Family's Response to care:  Patient unable to participate in discussion at this time.  Patient/Family's Understanding of and Emotional Response to Diagnosis, Current Treatment, and Prognosis:  Patient's daughter will need to be notified upon discharge/return to [facility].  Emotional  Assessment Appearance:  Appears stated age Attitude/Demeanor/Rapport:  Unable to Assess Affect (typically observed):  Unable to Assess Orientation:  Fluctuating Orientation (Suspected and/or reported Sundowners) Alcohol / Substance use:  Not Applicable Psych involvement (Current and /or in the community):  No (Comment)  Discharge Needs  Concerns to be addressed:  Care Coordination Readmission within the last 30 days:  No Current discharge risk:  None Barriers to Discharge:  No Barriers Identified   York SpanielMonica Aksel Bencomo, LCSW 07/26/2015, 1:28 PM

## 2015-07-26 NOTE — Care Management (Signed)
Care team has not identified any discharge needs.  Anticipate patient will review to [facility] where at her base line, she is total care.

## 2015-07-26 NOTE — Consult Note (Signed)
Patient ID: Suzanne Mcmillan, female   DOB: 9/6/04542/05/1915, 82 y.o.   MRN: [id number]  CC: Sigmoid Volvulus  HPI Suzanne ButcherLyda H Mcmillan is a 82 y.o. female currently admitted to the medicine service. Surgery consult requested by Dr. Desiree HaneVachani for evaluation of sigmoid volvulus. Patient is brought to the hospital yesterday from her assisted care facility after 2 days of nausea and vomiting. She was found to have dilated sigmoid colon on imaging consistent with a sigmoid volvulus. She underwent a sigmoidoscopy yesterday that showed a rectal stricture and then had a rapid release of gas from her sigmoid colon. A rectal tube was placed at that time. Throughout today her lower abdominal distention gradually recurred. Patient denies any complaints but is pleasantly demented. She is eating dinner at the time of my consultation.  HPI  Past Medical History  Diagnosis Date  . Depression   . Hypertension   . Dementia   . Allergy     Hay fever  . NSTEMI (non-ST elevated myocardial infarction) (HCC)   . DVT (deep venous thrombosis) [facility](HCC)     Past Surgical History  Procedure Laterality Date  . Tonsillectomy  1938  . Abdominal hysterectomy  1968  . Spine surgery  1983    ruptured disc lumbar  . Eye surgery  1992    cataract removal  . Breast surgery  1995    lumpectomy-left-benign  . Flexible sigmoidoscopy N/A 10-06-2015    Procedure: FLEXIBLE SIGMOIDOSCOPY;  Surgeon: Midge Miniumarren Wohl, MD;  Location: [facility] ENDOSCOPY;  Service: Endoscopy;  Laterality: N/A;    Family History  Problem Relation Age of Onset  . Leukemia Mother   . Heart disease Sister   . Cancer Brother 6466    stomach cancer  . Cancer Daughter     breast  . Arthritis Brother     severe rheumatoid arthritis  . Heart disease Sister     Social History Social History  Substance Use Topics  . Smoking status: Never Smoker   . Smokeless tobacco: None  . Alcohol Use: No    Allergies  Allergen Reactions  . Penicillins     REACTION: Rash, redness     Current Facility-Administered Medications  Medication Dose Route Frequency Provider Last Rate Last Dose  . 0.9 %  sodium chloride infusion   Intravenous Continuous Altamese DillingVaibhavkumar Vachhani, MD 75 mL/hr at 07/26/15 0906    . heparin injection 5,000 Units  5,000 Units Subcutaneous Q8H Altamese DillingVaibhavkumar Vachhani, MD   5,000 Units at 07/26/15 1445  . levothyroxine (SYNTHROID, LEVOTHROID) tablet 25 mcg  25 mcg Oral BH-q7a Altamese DillingVaibhavkumar Vachhani, MD      . ondansetron [facility](ZOFRAN) injection 4 mg  4 mg Intravenous Q6H PRN Altamese DillingVaibhavkumar Vachhani, MD      . sertraline (ZOLOFT) tablet 50 mg  50 mg Oral Daily Altamese DillingVaibhavkumar Vachhani, MD         Review of Systems A review of systems was impossible to obtain secondary to dementia  Physical Exam Blood pressure 115/73, pulse 67, temperature 98.1 F (36.7 C), temperature source Oral, resp. rate 18, height 5\' 7"  (1.702 m), weight 67.586 kg (149 lb), SpO2 93 %. CONSTITUTIONAL: Resting in bed in no acute distress. EYES: Pupils are equal, round, and reactive to light, Sclera are non-icteric. EARS, NOSE, MOUTH AND THROAT: The oropharynx is clear. The oral mucosa is pink and moist. Hearing is intact to voice. LYMPH NODES:  Lymph nodes in the neck are normal. RESPIRATORY:  Lungs are clear.  CARDIOVASCULAR: Heart is regular without murmurs,  gallops, or rubs. GI: The abdomen is soft, nontender, but moderately distended in the lower abdomen. There is no hepatosplenomegaly. T GU: Rectal deferred.   MUSCULOSKELETAL: Symmetric muscle strength and tone. No cyanosis or edema.   SKIN: Turgor is good and there are no pathologic skin lesions or ulcers. NEUROLOGIC: Motor and sensation is grossly normal. Cranial nerves are grossly intact. PSYCH:  Pleasantly demented. Oriented to person only.  Data Reviewed Images and labs reviewed. Labs are all within normal limits. Images are consistent with and concerning for sigmoid volvulus I have personally reviewed the patient's imaging,  laboratory findings and medical records.    Assessment    Sigmoid volvulus    Plan    82 year old pleasantly demented female with sigmoid volvulus. Attempted to reach her daughter who is her medical power of attorney over the phone. She did not answer and does not have a voicemail set up. Patient currently eating. She is a very poor surgical candidate given her age and other comorbidities. However, should she be unable to tolerate her diet secondary to her sigmoid volvulus she would either need surgical intervention or palliative care. Surgery will continue to follow with you and should she deteriorate would again attempt to contact daughter to discuss whether or not surgical intervention would be accepted as an option. No plans for surgical intervention at this time     Time spent with the patient was 30 minutes, with more than 50% of the time spent in face-to-face education, counseling and care coordination.     Ricarda Frame, MD FACS General Surgeon 07/26/2015, 5:40 PM

## 2015-07-26 NOTE — Progress Notes (Signed)
[employer] [facility] [facility] [place] [facility]   PATIENT NAME: Suzanne Mcmillan    MR#:  [id number]  DATE OF BIRTH:  08/22/1915  SUBJECTIVE:  CHIEF COMPLAINT:   Chief Complaint  Patient presents with  . Emesis     Had sigmoid volvulous, s/p sigmoidoscopy- found to have rectal stricture- s/p dilation and resolution of obstruction, now have rectal tube- denies any complains.  REVIEW OF SYSTEMS:   Due to dementia, denies any complains. And not able to give ROS.  ROS  DRUG ALLERGIES:   Allergies  Allergen Reactions  . Penicillins     REACTION: Rash, redness    VITALS:  Blood pressure 115/73, pulse 67, temperature 98.1 F (36.7 C), temperature source Oral, resp. rate 18, height 5\' 7"  (1.702 m), weight 67.586 kg (149 lb), SpO2 93 %.  PHYSICAL EXAMINATION:   GENERAL: 82 y.o.-year-old patient lying in the bed with no acute distress.  EYES: Pupils equal, round, reactive to light and accommodation. No scleral icterus. Extraocular muscles intact.  HEENT: Head atraumatic, normocephalic. Oropharynx and nasopharynx clear.  NECK: Supple, no jugular venous distention. No thyroid enlargement, no tenderness.  LUNGS: Normal breath sounds bilaterally, no wheezing, rales,rhonchi or crepitation. No use of accessory muscles of respiration.  CARDIOVASCULAR: S1, S2 normal. No murmurs, rubs, or gallops.  ABDOMEN: Soft, nontender, much less distended. Bowel sounds present . No organomegaly or mass.  EXTREMITIES: No pedal edema, cyanosis, or clubbing.  NEUROLOGIC: Cranial nerves II through XII are intact. Muscle strength 4/5 in all extremities. Sensation intact. Gait not checked.  PSYCHIATRIC: The patient is alert and not oriented, but Calm and comfortable.  SKIN: No obvious rash, lesion, or ulcer.   Physical Exam LABORATORY PANEL:   CBC  Recent Labs Lab 07/26/15 0325  WBC 7.3  HGB 11.0*  HCT 32.9*  PLT 205    ------------------------------------------------------------------------------------------------------------------  Chemistries   Recent Labs Lab 09-11-15 1123 07/26/15 0325  NA 140 140  K 4.5 3.6  CL 108 110  CO2 27 24  GLUCOSE 95 72  BUN 19 17  CREATININE 1.02* 0.88  CALCIUM 9.1 8.0*  AST 27  --   ALT 13*  --   ALKPHOS 90  --   BILITOT 1.2  --    ------------------------------------------------------------------------------------------------------------------  Cardiac Enzymes No results for input(s): TROPONINI in the last 168 hours. ------------------------------------------------------------------------------------------------------------------  RADIOLOGY:  Dg Abd 1 View  07/26/2015  CLINICAL DATA:  Pt has dementia.poor historian. best images obtained. Tech held. Sigmoid volvulus EXAM: ABDOMEN - 1 VIEW COMPARISON:  2015/08/16 FINDINGS: Again noted is dilatation of the sigmoid colon greater than other colonic loops. No evidence for small bowel dilatation. No free intraperitoneal air identified on these supine views. The degree of distension has improved slightly since prior study. Configuration of the sigmoid colon suggests volvulus. IMPRESSION: Slight decrease in the amount of sigmoid distension. Persistent configuration compatible with sigmoid volvulus. Electronically Signed   By: Norva PavlovElizabeth  Brown M.D.   On: 07/26/2015 15:10   Dg Abd Acute W/chest  03/06/2015  CLINICAL DATA:  Vomiting for 2 days EXAM: DG ABDOMEN ACUTE W/ 1V CHEST COMPARISON:  06/18/2013 FINDINGS: Cardiomediastinal silhouette is stable. There is mild elevation of the left hemidiaphragm. Probable chronic mild interstitial prominence. No pulmonary edema. Mild left basilar atelectasis or scarring. There is no evidence of free abdominal air. Mild gaseous distended small bowel loops mid abdomen probable ileus. Significant gaseous distension of the colon. There is significant distension of the colon with gas and some  liquid  debris in mid lower abdomen up to 14 cm. Findings are highly suspicious for sigmoid volvulus. Paucity of bowel gas within rectum. IMPRESSION: No acute disease within chest. Significant gaseous distension of the colon up to 14 cm. Findings highly suspicious for colonic obstruction or sigmoid volvulus. No evidence of free abdominal air. Further correlation with CT scan of the abdomen and pelvis is recommended. These results were called by telephone at the time of interpretation on 08-13-15 at 11:25 am to Dr. Phineas Semen , who verbally acknowledged these results. Electronically Signed   By: Natasha Mead M.D.   On: August 13, 2015 11:26    ASSESSMENT AND PLAN:   Principal Problem:   Intestinal obstruction (HCC) Active Problems:   Sigmoid volvulus (HCC)   Stenosis of rectum and anus  * Intestinal obstruction, possible sigmoid volvulus  Sigmoidoscopy done by GI- had rectal stricture.   Dilatation done, now have rectal tube.   Diet started.  * Hypothyroidism  Continue levothyroxine  * Hypertension  Continue metoprolol  * Depression  Until the Zoloft.  * Dementia  At baseline, chronic complaint.   All the records are reviewed and case discussed with Care Management/Social Workerr. Management plans discussed with the patient, family and they are in agreement.  CODE STATUS: DNR  TOTAL TIME TAKING CARE OF THIS PATIENT: 35 minutes.     POSSIBLE D/C IN 1-2 DAYS, DEPENDING ON CLINICAL CONDITION.   Altamese Dilling M.D on 07/26/2015   Between 7am to 6pm - Pager - [phone]  After 6pm go to [email or web address] - password [email or web address]  [facility]  Office  [phone]  CC: Primary care physician; Wynona Dove, MD  Note: This dictation was prepared with Dragon dictation along with smaller phrase technology. Any transcriptional errors that result from this process are unintentional.

## 2015-08-01 NOTE — Clinical Social Work Note (Signed)
Patient expired early this morning.  York SpanielMonica Azhane Eckart MSW,LCSW [phone]

## 2015-08-01 NOTE — Progress Notes (Signed)
Pt was found unresponsive. Pt was assessed and found to have had no heart rate or blood Pressure and had  expired. Pt was a DNR. Pt family was notified and primary nurse spoke to daughter June. Pt daughter decided not to come to the hospital. [facility] was notified in regard to pronounced the death.

## 2015-08-01 DEATH — deceased

## 2015-08-31 NOTE — Discharge Summary (Signed)
  Date of death- 07/18/2015. Cause of death- intestinal obstruction.  Brief hospital course.  Patient came with complaint of intestinal obstruction and vomiting, found to have dilated colon, GI saw the patient and sigmoidoscopy was done- and he found having rectal stricture and after dilation of that patient had a lot of liquid stool came out, so he suggested to the rectal tube. After the procedure next day patient started distention of her abdomen again and so we had to call the surgical consult to help further planning. Patient was comfortable during the whole hospital stay but next day morning she was found dead in the room. As she was DO NOT RESUSCITATE no resuscitation attempts were done.

## 2016-10-14 IMAGING — DX DG ABDOMEN ACUTE W/ 1V CHEST
3 series · 4 of 4 positions shown · non-contrast
Comparison: 06/18/2013

CLINICAL DATA: Vomiting for 2 days

EXAM:
DG ABDOMEN ACUTE W/ 1V CHEST

[chest pa]
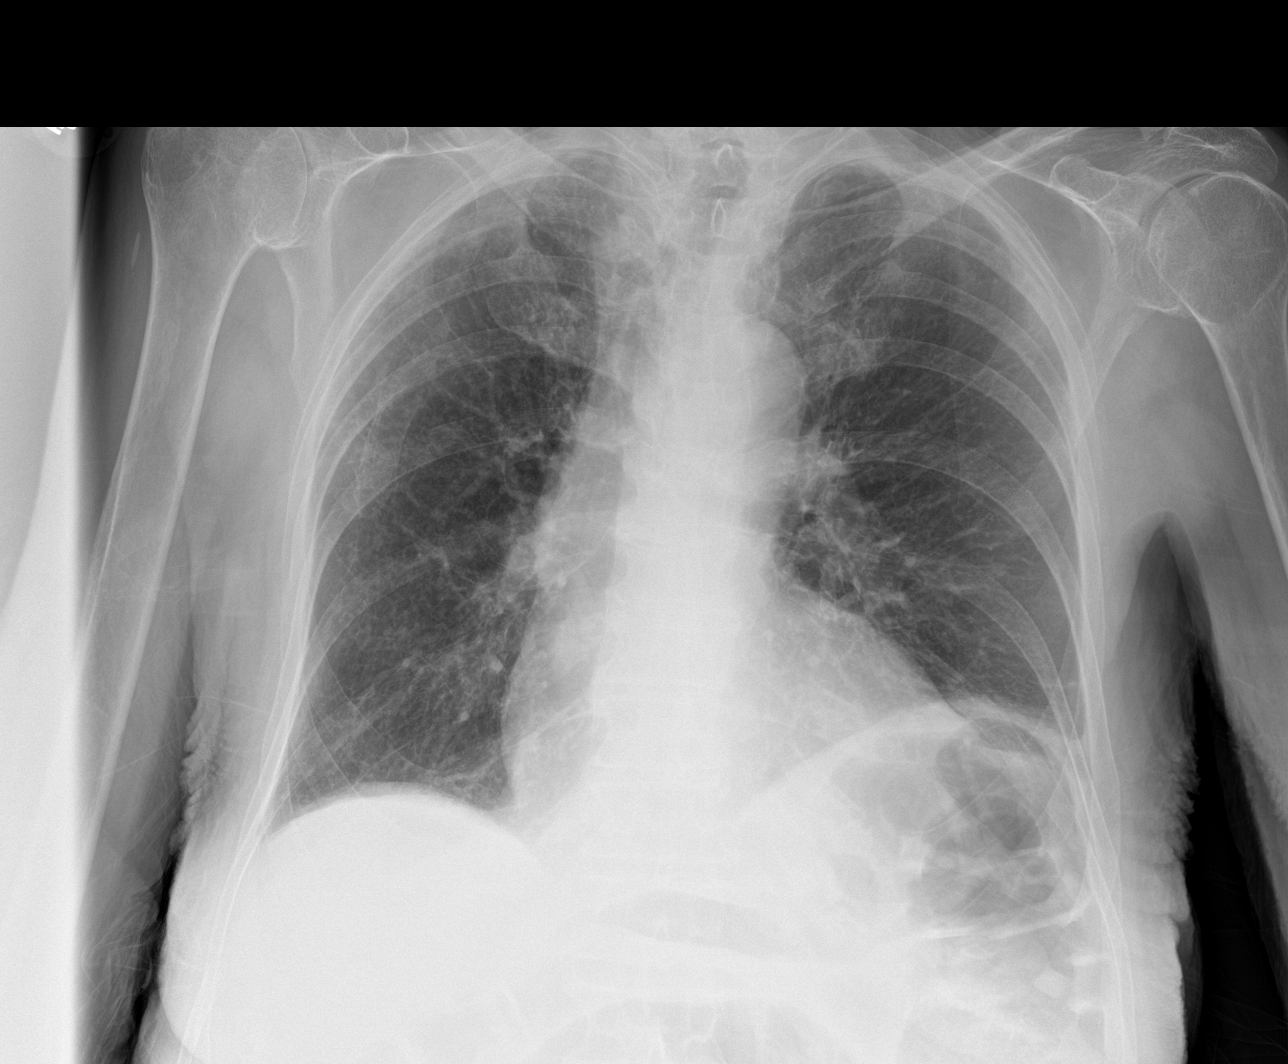

[abdomen erect]
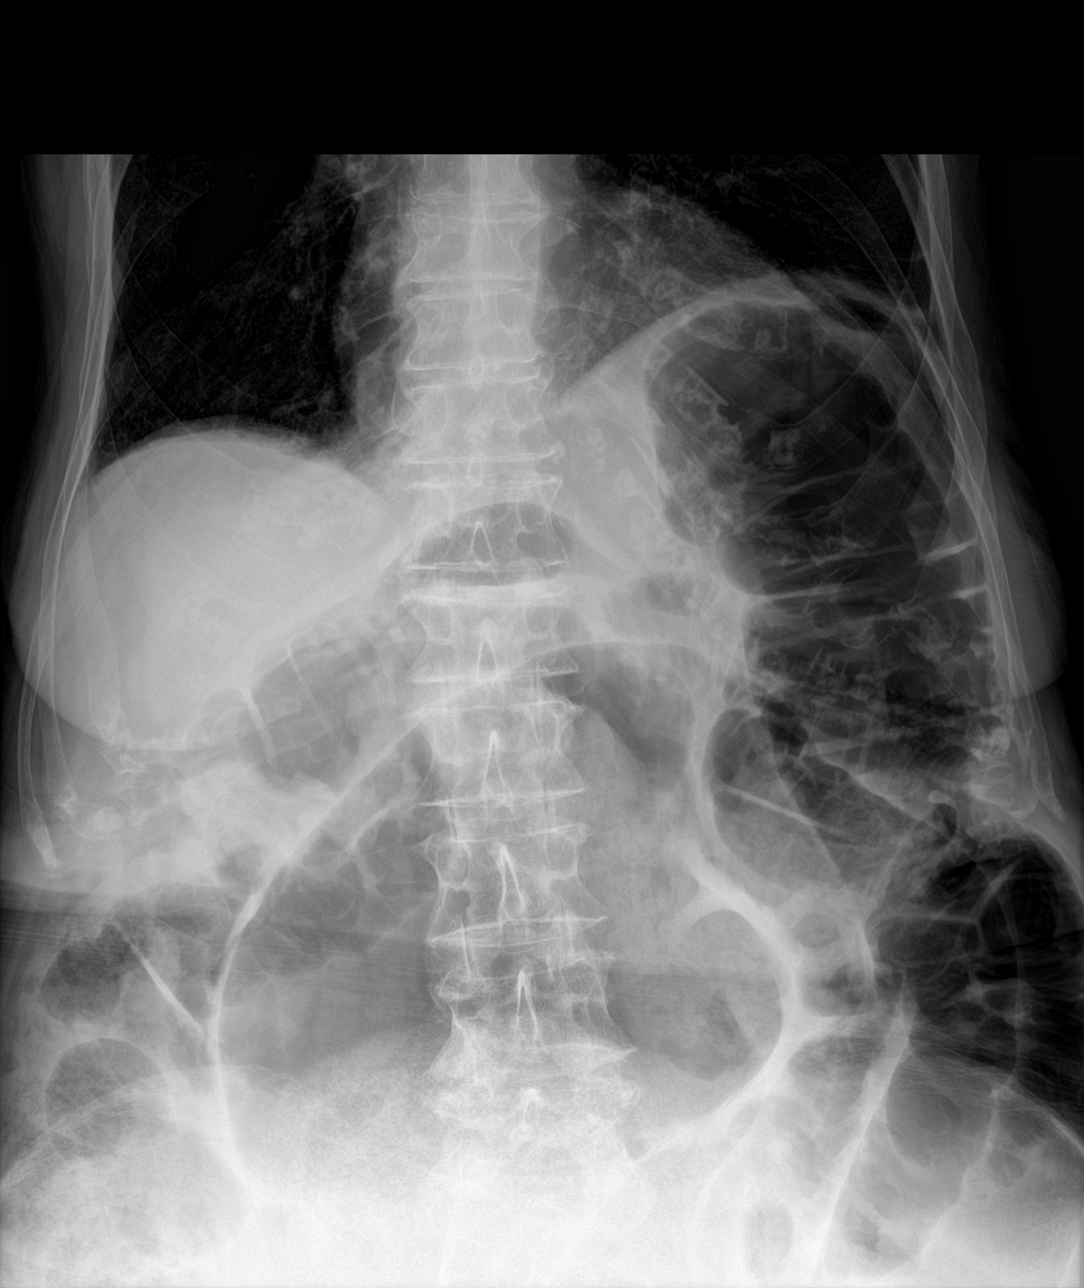

[Series 3: abdomen supine · 0.14mm/px · 2 of 2 slices shown]
[im 1/2]
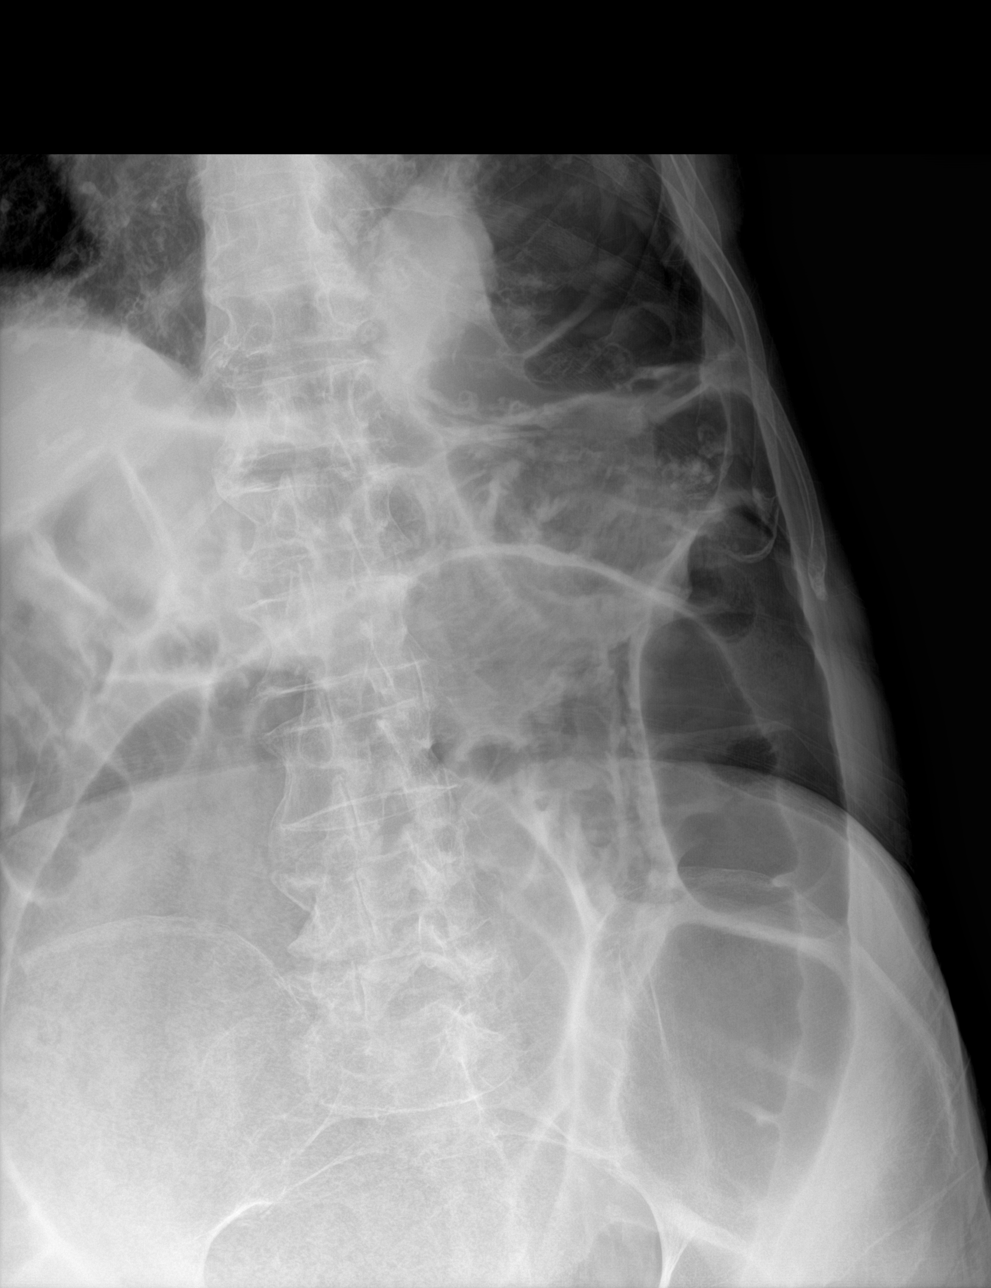
[im 2/2]
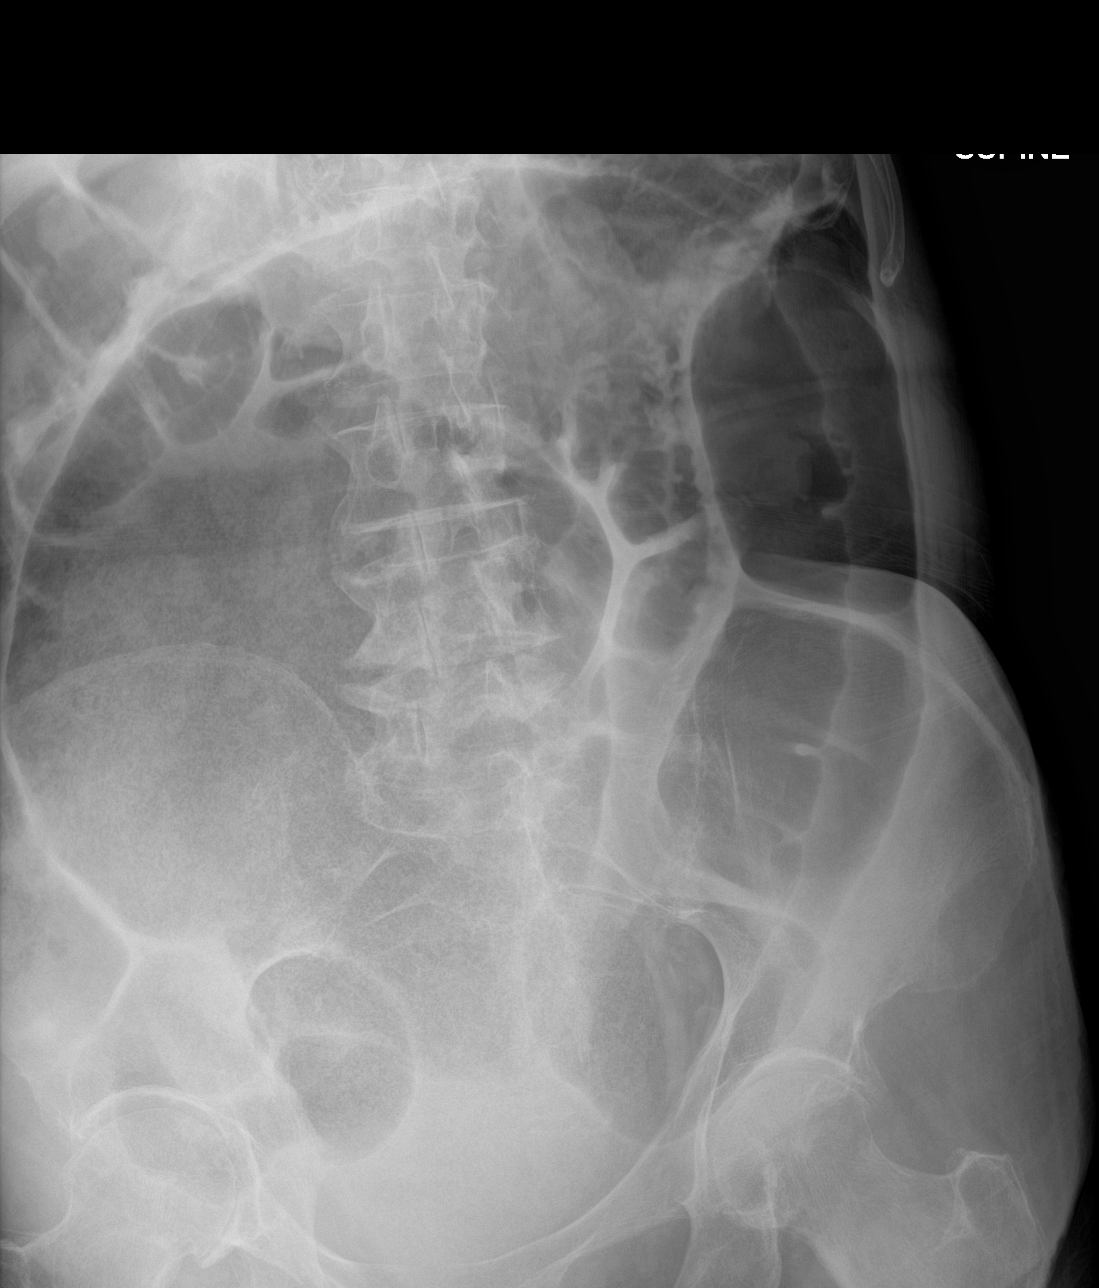

[4 of 4 positions shown; findings below may reference images not displayed]

FINDINGS: Cardiomediastinal silhouette is stable. There is mild elevation of
the left hemidiaphragm. Probable chronic mild interstitial
prominence. No pulmonary edema. Mild left basilar atelectasis or
scarring. There is no evidence of free abdominal air. Mild gaseous
distended small bowel loops mid abdomen probable ileus. Significant
gaseous distension of the colon. There is significant distension of
the colon with gas and some liquid debris in mid lower abdomen up to
14 cm. Findings are highly suspicious for sigmoid volvulus. Paucity
of bowel gas within rectum.
IMPRESSION: No acute disease within chest. Significant gaseous distension of the
colon up to 14 cm. Findings highly suspicious for colonic
obstruction or sigmoid volvulus. No evidence of free abdominal air.
Further correlation with CT scan of the abdomen and pelvis is
recommended. These results were called by telephone at the time of
interpretation on 07/25/2015 at [DATE] to Dr. DON LOLITO ONACRAM , who
verbally acknowledged these results.
